# Patient Record
Sex: Female | Born: 1985 | Race: Black or African American | Hispanic: No | Marital: Single | Smoking: Never smoker
Health system: Southern US, Community
[De-identification: ages and names within clinical notes are randomized; demographics above are authoritative.]

## PROBLEM LIST (undated history)

## (undated) ENCOUNTER — Inpatient Hospital Stay (HOSPITAL_COMMUNITY): Payer: Self-pay

## (undated) DIAGNOSIS — D649 Anemia, unspecified: Secondary | ICD-10-CM

## (undated) DIAGNOSIS — E669 Obesity, unspecified: Secondary | ICD-10-CM

## (undated) DIAGNOSIS — N390 Urinary tract infection, site not specified: Secondary | ICD-10-CM

## (undated) DIAGNOSIS — A749 Chlamydial infection, unspecified: Secondary | ICD-10-CM

## (undated) DIAGNOSIS — I10 Essential (primary) hypertension: Secondary | ICD-10-CM

## (undated) HISTORY — PX: WISDOM TOOTH EXTRACTION: SHX21

## (undated) HISTORY — DX: Anemia, unspecified: D64.9

## (undated) HISTORY — PX: GASTRIC BYPASS: SHX52

## (undated) HISTORY — DX: Essential (primary) hypertension: I10

## (undated) HISTORY — DX: Obesity, unspecified: E66.9

---
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
## 2009-05-12 ENCOUNTER — Inpatient Hospital Stay (HOSPITAL_COMMUNITY): Admission: AD | Admit: 2009-05-12 | Discharge: 2009-05-12 | Payer: Self-pay | Admitting: Obstetrics & Gynecology

## 2009-05-18 ENCOUNTER — Ambulatory Visit: Payer: Self-pay | Admitting: Obstetrics and Gynecology

## 2009-05-18 ENCOUNTER — Inpatient Hospital Stay (HOSPITAL_COMMUNITY): Admission: AD | Admit: 2009-05-18 | Discharge: 2009-05-18 | Payer: Self-pay | Admitting: Obstetrics

## 2009-07-04 ENCOUNTER — Ambulatory Visit (HOSPITAL_COMMUNITY): Admission: RE | Admit: 2009-07-04 | Discharge: 2009-07-04 | Payer: Self-pay | Admitting: Obstetrics & Gynecology

## 2009-11-19 ENCOUNTER — Inpatient Hospital Stay (HOSPITAL_COMMUNITY): Admission: AD | Admit: 2009-11-19 | Discharge: 2009-11-19 | Payer: Self-pay | Admitting: Obstetrics and Gynecology

## 2009-11-21 ENCOUNTER — Inpatient Hospital Stay (HOSPITAL_COMMUNITY): Admission: AD | Admit: 2009-11-21 | Discharge: 2009-11-21 | Payer: Self-pay | Admitting: Obstetrics and Gynecology

## 2009-11-22 ENCOUNTER — Inpatient Hospital Stay (HOSPITAL_COMMUNITY): Admission: AD | Admit: 2009-11-22 | Discharge: 2009-11-22 | Payer: Self-pay | Admitting: Obstetrics and Gynecology

## 2009-11-24 ENCOUNTER — Inpatient Hospital Stay (HOSPITAL_COMMUNITY): Admission: AD | Admit: 2009-11-24 | Discharge: 2009-11-26 | Payer: Self-pay | Admitting: Obstetrics and Gynecology

## 2010-05-14 LAB — URINALYSIS, ROUTINE W REFLEX MICROSCOPIC
Bilirubin Urine: NEGATIVE
Glucose, UA: NEGATIVE mg/dL
Hgb urine dipstick: NEGATIVE
Ketones, ur: NEGATIVE mg/dL
Nitrite: NEGATIVE
Urobilinogen, UA: 1 mg/dL (ref 0.0–1.0)
pH: 6 (ref 5.0–8.0)

## 2010-05-14 LAB — COMPREHENSIVE METABOLIC PANEL
ALT: 14 U/L (ref 0–35)
ALT: 16 U/L (ref 0–35)
ALT: 18 U/L (ref 0–35)
AST: 16 U/L (ref 0–37)
AST: 28 U/L (ref 0–37)
Albumin: 2.6 g/dL — ABNORMAL LOW (ref 3.5–5.2)
Albumin: 2.7 g/dL — ABNORMAL LOW (ref 3.5–5.2)
Alkaline Phosphatase: 137 U/L — ABNORMAL HIGH (ref 39–117)
BUN: 4 mg/dL — ABNORMAL LOW (ref 6–23)
CO2: 23 mEq/L (ref 19–32)
Calcium: 9.3 mg/dL (ref 8.4–10.5)
Calcium: 9.4 mg/dL (ref 8.4–10.5)
Chloride: 104 mEq/L (ref 96–112)
Chloride: 106 mEq/L (ref 96–112)
Creatinine, Ser: 0.51 mg/dL (ref 0.4–1.2)
GFR calc Af Amer: >60 mL/min (ref >60)
GFR calc Af Amer: >60 mL/min (ref >60)
GFR calc non Af Amer: >60 mL/min (ref >60)
GFR calc non Af Amer: >60 mL/min (ref >60)
Glucose, Bld: 88 mg/dL (ref 70–99)
Glucose, Bld: 90 mg/dL (ref 70–99)
Potassium: 3.3 mEq/L — ABNORMAL LOW (ref 3.5–5.1)
Potassium: 3.8 mEq/L (ref 3.5–5.1)
Sodium: 134 mEq/L — ABNORMAL LOW (ref 135–145)
Sodium: 137 mEq/L (ref 135–145)
Total Bilirubin: 0.3 mg/dL (ref 0.3–1.2)
Total Protein: 5.8 g/dL — ABNORMAL LOW (ref 6.0–8.3)
Total Protein: 6.6 g/dL (ref 6.0–8.3)

## 2010-05-14 LAB — LACTATE DEHYDROGENASE: LDH: 141 U/L (ref 94–250)

## 2010-05-14 LAB — CBC
HCT: 29.1 % — ABNORMAL LOW (ref 36.0–46.0)
HCT: 35 % — ABNORMAL LOW (ref 36.0–46.0)
HCT: 35.4 % — ABNORMAL LOW (ref 36.0–46.0)
Hemoglobin: 11.3 g/dL — ABNORMAL LOW (ref 12.0–15.0)
Hemoglobin: 11.5 g/dL — ABNORMAL LOW (ref 12.0–15.0)
Hemoglobin: 9.5 g/dL — ABNORMAL LOW (ref 12.0–15.0)
MCH: 24.9 pg — ABNORMAL LOW (ref 26.0–34.0)
MCHC: 32.7 g/dL (ref 30.0–36.0)
MCV: 75.4 fL — ABNORMAL LOW (ref 78.0–100.0)
MCV: 75.6 fL — ABNORMAL LOW (ref 78.0–100.0)
Platelets: 268 10*3/uL (ref 150–400)
Platelets: 302 10*3/uL (ref 150–400)
Platelets: 312 10*3/uL (ref 150–400)
RBC: 4.6 MIL/uL (ref 3.87–5.11)
RBC: 4.63 MIL/uL (ref 3.87–5.11)
RDW: 16.9 % — ABNORMAL HIGH (ref 11.5–15.5)
RDW: 17.2 % — ABNORMAL HIGH (ref 11.5–15.5)
WBC: 6.5 10*3/uL (ref 4.0–10.5)
WBC: 7.3 10*3/uL (ref 4.0–10.5)
WBC: 7.4 10*3/uL (ref 4.0–10.5)
WBC: 7.7 10*3/uL (ref 4.0–10.5)

## 2010-05-14 LAB — URIC ACID: Uric Acid, Serum: 6 mg/dL (ref 2.4–7.0)

## 2010-05-24 LAB — URINALYSIS, ROUTINE W REFLEX MICROSCOPIC
Glucose, UA: NEGATIVE mg/dL
Ketones, ur: NEGATIVE mg/dL
Leukocytes, UA: NEGATIVE
Specific Gravity, Urine: 1.025 (ref 1.005–1.030)
Specific Gravity, Urine: >1.03 — ABNORMAL HIGH (ref 1.005–1.030)
Urobilinogen, UA: 0.2 mg/dL (ref 0.0–1.0)
pH: 6 (ref 5.0–8.0)
pH: 6 (ref 5.0–8.0)

## 2010-05-24 LAB — CBC
MCHC: 31.4 g/dL (ref 30.0–36.0)
WBC: 6.7 10*3/uL (ref 4.0–10.5)

## 2010-05-24 LAB — COMPREHENSIVE METABOLIC PANEL
AST: 21 U/L (ref 0–37)
Albumin: 3 g/dL — ABNORMAL LOW (ref 3.5–5.2)
Alkaline Phosphatase: 86 U/L (ref 39–117)
BUN: 3 mg/dL — ABNORMAL LOW (ref 6–23)
GFR calc Af Amer: >60 mL/min (ref >60)
GFR calc non Af Amer: >60 mL/min (ref >60)
Glucose, Bld: 84 mg/dL (ref 70–99)
Total Bilirubin: 0.5 mg/dL (ref 0.3–1.2)

## 2010-05-24 LAB — URINE MICROSCOPIC-ADD ON

## 2010-07-14 NOTE — H&P (Signed)
Connie Taylor, MOTSINGER              ACCOUNT NO.:  [id number]   MEDICAL RECORD NO.:  [id number]          PATIENT TYPE:  INP   LOCATION:  9173                          FACILITY:  [facility]   PHYSICIAN:  Charles A. Clearance Coots, M.D.DATE OF BIRTH:  December 27, 1985   DATE OF ADMISSION:  09/07/2006  DATE OF DISCHARGE:                              HISTORY & PHYSICAL   REASON FOR ADMISSION:  25 year old black female, G1, estimated date of  confinement of October 02, 2006 presented to the office for prenatal visit  on her last  prenatal visit and had elevated blood pressures.  The  patient has been followed for slightly elevated blood pressures  throughout the pregnancy.  She transferred her care from [place] at late  third trimester.  The patient denies headache or visual changes.  Had  been sent to [facility] over the past week for preeclampsia  workup, her preeclampsia labs have been within normal limits, but the  patient has continued to have labile blood pressures.  She is therefore  admitted to the hospital for blood pressure management and to get  started on oral antihypertensive agent.   PAST SURGICAL HISTORY:  None.   PAST MEDICAL HISTORY:  Anemia.   MEDICATIONS:  Prenatal vitamins.   ALLERGIES:  NO KNOWN DRUG ALLERGIES.   SOCIAL HISTORY:  Single.  Negative tobacco, alcohol, or recreational  drug use.   FAMILY HISTORY:  No major illnesses known.   PHYSICAL EXAMINATION:  GENERAL:  Obese black female in no acute  distress.  VITAL SIGNS:  Afebrile.  Blood pressure 168/102.  LUNGS:  Clear to auscultation bilaterally.  HEART:  Regular rate and rhythm.  ABDOMEN:  Gravid, nontender.  PELVIC:  Cervix is 1-2 cm dilated, long, and the vertex was at a -3  station.   IMPRESSION:  [redacted] weeks gestation with pregnancy-induced hypertension.   PLAN:  Admit to be started on oral antihypertensive and then discharge  home on bedrest once blood pressures are stable.      Charles A. Clearance Coots, M.D.  Electronically Signed     CAH/MEDQ  D:  09/07/2006  T:  09/08/2006  Job:  [id number]   cc:   Labor and Delivery Suite

## 2010-12-15 LAB — URINALYSIS, ROUTINE W REFLEX MICROSCOPIC
Bilirubin Urine: NEGATIVE
Bilirubin Urine: NEGATIVE
Glucose, UA: NEGATIVE
Hgb urine dipstick: NEGATIVE
Ketones, ur: NEGATIVE
Ketones, ur: NEGATIVE
Nitrite: NEGATIVE
Specific Gravity, Urine: 1.015
Specific Gravity, Urine: <1.005 — ABNORMAL LOW
pH: 6
pH: 6.5

## 2010-12-15 LAB — CBC
HCT: 29.1 — ABNORMAL LOW
HCT: 33.5 — ABNORMAL LOW
HCT: 34.1 — ABNORMAL LOW
Hemoglobin: 10.6 — ABNORMAL LOW
Hemoglobin: 10.7 — ABNORMAL LOW
Hemoglobin: 10.8 — ABNORMAL LOW
MCHC: 31.6
MCHC: 31.6
MCHC: 31.8
MCHC: 31.9
MCV: 72.4 — ABNORMAL LOW
MCV: 72.4 — ABNORMAL LOW
MCV: 72.7 — ABNORMAL LOW
MCV: 72.7 — ABNORMAL LOW
MCV: 72.8 — ABNORMAL LOW
Platelets: 307
Platelets: 338
RBC: 4.47
RBC: 4.62
RBC: 4.68
RBC: 4.7
WBC: 11.4 — ABNORMAL HIGH
WBC: 11.5 — ABNORMAL HIGH
WBC: 6.8
WBC: 8.1

## 2010-12-15 LAB — COMPREHENSIVE METABOLIC PANEL
ALT: 13
AST: 18
Albumin: 2.5 — ABNORMAL LOW
Albumin: 2.7 — ABNORMAL LOW
Alkaline Phosphatase: 214 — ABNORMAL HIGH
BUN: 1 — ABNORMAL LOW
CO2: 24
CO2: 24
Calcium: 9.2
Chloride: 106
Creatinine, Ser: 0.5
GFR calc Af Amer: >60
GFR calc non Af Amer: >60
GFR calc non Af Amer: >60
Glucose, Bld: 86
Potassium: 3.7
Sodium: 137
Total Bilirubin: 0.5
Total Protein: 6.3

## 2010-12-15 LAB — URINE MICROSCOPIC-ADD ON

## 2010-12-15 LAB — LACTATE DEHYDROGENASE: LDH: 145

## 2010-12-16 LAB — URINALYSIS, ROUTINE W REFLEX MICROSCOPIC
Bilirubin Urine: NEGATIVE
Glucose, UA: NEGATIVE
Ketones, ur: NEGATIVE
Leukocytes, UA: NEGATIVE
Nitrite: NEGATIVE
Protein, ur: NEGATIVE
Specific Gravity, Urine: <1.005 — ABNORMAL LOW
Urobilinogen, UA: 1
pH: 7

## 2010-12-16 LAB — URINE MICROSCOPIC-ADD ON

## 2010-12-17 LAB — URINE MICROSCOPIC-ADD ON

## 2010-12-17 LAB — URINE CULTURE

## 2010-12-17 LAB — URINALYSIS, ROUTINE W REFLEX MICROSCOPIC
Glucose, UA: NEGATIVE
Ketones, ur: NEGATIVE
pH: 6

## 2011-01-19 ENCOUNTER — Inpatient Hospital Stay (HOSPITAL_COMMUNITY)
Admission: AD | Admit: 2011-01-19 | Discharge: 2011-01-19 | Disposition: A | Discharge status: Home / Self Care | Payer: 59 | Source: Ambulatory Visit | Attending: Family Medicine | Admitting: Family Medicine

## 2011-01-19 ENCOUNTER — Encounter (HOSPITAL_COMMUNITY): Payer: Self-pay | Admitting: *Deleted

## 2011-01-19 DIAGNOSIS — N898 Other specified noninflammatory disorders of vagina: Secondary | ICD-10-CM | POA: Insufficient documentation

## 2011-01-19 DIAGNOSIS — Z3201 Encounter for pregnancy test, result positive: Secondary | ICD-10-CM

## 2011-01-19 DIAGNOSIS — O99891 Other specified diseases and conditions complicating pregnancy: Secondary | ICD-10-CM | POA: Insufficient documentation

## 2011-01-19 HISTORY — DX: Chlamydial infection, unspecified: A74.9

## 2011-01-19 LAB — URINALYSIS, ROUTINE W REFLEX MICROSCOPIC
Bilirubin Urine: NEGATIVE
Glucose, UA: NEGATIVE mg/dL
Hgb urine dipstick: NEGATIVE
Specific Gravity, Urine: 1.025 (ref 1.005–1.030)

## 2011-01-19 LAB — WET PREP, GENITAL: Yeast Wet Prep HPF POC: NONE SEEN

## 2011-01-19 LAB — POCT PREGNANCY, URINE: Preg Test, Ur: POSITIVE

## 2011-01-19 NOTE — Progress Notes (Signed)
Patient had an Implanon removed in June 2012, has had irregular cycles.

## 2011-01-19 NOTE — ED Provider Notes (Signed)
History     CSN: [id number] Arrival date & time: 01/19/2011  6:04 PM   None     Chief Complaint  Patient presents with  . Dysuria    HPI Connie Taylor is a 25 y.o. female at [redacted]w[redacted]d gestation who presents to [facility] for vaginal discharge and a pressure feeling with urination. Denies vaginal bleeding or abdominal pain. She has been with her current sex partner x 4 years. Last pap smear one year ago and was normal. History of Chlamydia > 4 years ago. Had implant contraception but had it removed in August. LMP 11/14/10.  Patient did not know she was pregnant. States that she was told her periods may be irregular for a while after the implant was out. States she is happy to be pregnant. She denies bleeding or abdominal pain. The history was provided by the patient.  Past Medical History  Diagnosis Date  . No pertinent past medical history   . Chlamydia 4 years ago    Past Surgical History  Procedure Date  . No past surgeries     Family History  Problem Relation Age of Onset  . Diabetes Neg Hx   . Cancer Neg Hx   . Hypertension Neg Hx     History  Substance Use Topics  . Smoking status: Never Smoker   . Smokeless tobacco: Not on file  . Alcohol Use: No    OB History    Grav Para Term Preterm Abortions TAB SAB Ect Mult Living   1               Review of Systems  Constitutional: Negative for fever, chills, diaphoresis and fatigue.  HENT: Negative for ear pain, congestion, sore throat, neck pain, dental problem and sinus pressure.   Eyes: Negative for photophobia, pain and discharge.  Respiratory: Negative for cough, chest tightness and wheezing.   Gastrointestinal: Negative for nausea, vomiting, abdominal pain, diarrhea, constipation and abdominal distention.  Genitourinary: Positive for vaginal discharge. Negative for dysuria, urgency, frequency, flank pain, vaginal bleeding, difficulty urinating and pelvic pain.  Musculoskeletal: Positive for back pain. Negative for  myalgias and gait problem.  Skin: Negative for color change and rash.  Neurological: Negative for dizziness, speech difficulty, weakness, light-headedness, numbness and headaches.  Psychiatric/Behavioral: Negative for confusion and agitation.    Allergies  Review of patient's allergies indicates not on file.  Home Medications  No current outpatient prescriptions on file.  BP 150/85  Pulse 92  Temp(Src) 99.1 F (37.3 C) (Oral)  Resp 22  Ht 5\' 7"  (1.702 m)  Wt 284 lb 9.6 oz (129.094 kg)  BMI 44.57 kg/m2  SpO2 99%  LMP 11/14/2010  Physical Exam  Nursing note and vitals reviewed. Constitutional: She is oriented to person, place, and time.       obese  HENT:  Head: Normocephalic and atraumatic.  Eyes: EOM are normal.  Neck: Neck supple.  Cardiovascular: Normal rate.   Pulmonary/Chest: Effort normal. No respiratory distress.  Abdominal: Soft. There is no tenderness.  Genitourinary:       External genitalia without lesions. White discharge vaginal vault. Cervix closed, long, no CMT, no adnexal tenderness. Unable to determine size of uterus due to patient habitus.  Musculoskeletal: Normal range of motion.  Neurological: She is alert and oriented to person, place, and time. No cranial nerve deficit.  Skin: Skin is warm and dry.   Results for orders placed during the hospital encounter of 01/19/11 (from the past 24 hour(s))  URINALYSIS, ROUTINE W REFLEX MICROSCOPIC     Status: Normal   Collection Time   01/19/11  6:40 PM      Component Value Range   Color, Urine YELLOW  YELLOW    Appearance CLEAR  CLEAR    Specific Gravity, Urine 1.025  1.005 - 1.030    pH 6.5  5.0 - 8.0    Glucose, UA NEGATIVE  NEGATIVE (mg/dL)   Hgb urine dipstick NEGATIVE  NEGATIVE    Bilirubin Urine NEGATIVE  NEGATIVE    Ketones, ur NEGATIVE  NEGATIVE (mg/dL)   Protein, ur NEGATIVE  NEGATIVE (mg/dL)   Urobilinogen, UA 0.2  0.0 - 1.0 (mg/dL)   Nitrite NEGATIVE  NEGATIVE    Leukocytes, UA NEGATIVE   NEGATIVE   POCT PREGNANCY, URINE     Status: Normal   Collection Time   01/19/11  6:47 PM      Component Value Range   Preg Test, Ur POSITIVE    WET PREP, GENITAL     Status: Abnormal   Collection Time   01/19/11  9:10 PM      Component Value Range   Yeast, Wet Prep NONE SEEN  NONE SEEN    Trich, Wet Prep NONE SEEN  NONE SEEN    Clue Cells, Wet Prep NONE SEEN  NONE SEEN    WBC, Wet Prep HPF POC FEW (*) NONE SEEN     Assessment: Pregnancy   Vaginal discharge  Plan:  Start prenatal care   UC, GC, CT cultures pending   Return for problems. Patient plans [facility] Care with CC/OB ED Course  Procedures    MDM          [facility], NP 01/19/11 2133

## 2011-01-19 NOTE — Progress Notes (Signed)
Patient states she has had pain with urination on and off for about 2 days. Has a slight discharge when wiping after urinating. Has low back pain on and off, no pain at this time.

## 2011-01-19 NOTE — Progress Notes (Signed)
Pt in c/o vaginal discharge with occasional burning with urination.  Reports intermittent lower back pain.  Symptoms x2 days.  Denies any new sex partners.

## 2011-01-20 LAB — URINE CULTURE: Colony Count: 40000

## 2011-01-20 NOTE — ED Provider Notes (Signed)
Chart reviewed and agree with management and plan.  

## 2011-01-30 LAB — OB RESULTS CONSOLE ABO/RH: RH Type: POSITIVE

## 2011-01-30 LAB — OB RESULTS CONSOLE RUBELLA ANTIBODY, IGM: Rubella: NON-IMMUNE/NOT IMMUNE

## 2011-01-30 LAB — OB RESULTS CONSOLE ANTIBODY SCREEN: Antibody Screen: NEGATIVE

## 2011-01-30 LAB — OB RESULTS CONSOLE HIV ANTIBODY (ROUTINE TESTING): HIV: NONREACTIVE

## 2011-03-24 ENCOUNTER — Encounter (HOSPITAL_COMMUNITY): Payer: Self-pay

## 2011-03-24 ENCOUNTER — Inpatient Hospital Stay (HOSPITAL_COMMUNITY)
Admission: AD | Admit: 2011-03-24 | Discharge: 2011-03-24 | Disposition: A | Discharge status: Home / Self Care | Payer: 59 | Source: Ambulatory Visit | Attending: Obstetrics and Gynecology | Admitting: Obstetrics and Gynecology

## 2011-03-24 DIAGNOSIS — R51 Headache: Secondary | ICD-10-CM | POA: Insufficient documentation

## 2011-03-24 DIAGNOSIS — O99891 Other specified diseases and conditions complicating pregnancy: Secondary | ICD-10-CM | POA: Insufficient documentation

## 2011-03-24 DIAGNOSIS — O26899 Other specified pregnancy related conditions, unspecified trimester: Secondary | ICD-10-CM

## 2011-03-24 DIAGNOSIS — R030 Elevated blood-pressure reading, without diagnosis of hypertension: Secondary | ICD-10-CM | POA: Insufficient documentation

## 2011-03-24 DIAGNOSIS — E669 Obesity, unspecified: Secondary | ICD-10-CM | POA: Insufficient documentation

## 2011-03-24 DIAGNOSIS — O99210 Obesity complicating pregnancy, unspecified trimester: Secondary | ICD-10-CM | POA: Insufficient documentation

## 2011-03-24 MED ORDER — IBUPROFEN 800 MG PO TABS
800.0000 mg | ORAL_TABLET | Freq: Once | ORAL | Status: AC
  Administered 2011-03-24: 800 mg via ORAL
  Filled 2011-03-24: qty 1

## 2011-03-24 NOTE — Progress Notes (Signed)
Pt states she has had a headache since Monday , tylenol has worked up until today, and is not longer helping

## 2011-03-24 NOTE — ED Provider Notes (Signed)
History   Connie Taylor is a 26 y.o. Obese black female who presents unannounced at 14.1 weeks per [facility] 09/21/11 with CC of headache w/ onset since this AM after she awoke, that hasn't resolved w/ Tylenol.  Headache is just on Pt's Lt side and pain is concentrated in frontal and temporal area and "behind eye."  She has taken Tylenol today and last dose around 1700.  Only other c/o is nausea, and feels related to pain and because hasn't felt like eating very much today.  Had lunch and since has only had "chips" around 1500.  No emesis.  No fever, chills, or body aches.  No recent accidents or injuries.  Pt was primarily concerned about headache b/c she has had PIH w/ previous pregnancies and was concerned blood pressure may be elevated.  Pt denies UTI s/s, resp, or other GI complaints.   Lights were off in room when went in for eval.  Pt lying on Rt side, b/c hurts if she tries to lay on her Lt side. Can't sleep b/c of pain.  No previous h/o migraines. Pregnancy r/f:  1.  Closely-spaced pregnancies  2. obese  3.  H/o anemia  4.  Ho chlamydia  5.  H/o PIH x2 vs CHTN  6.  Rubella non-immune Chief Complaint  Patient presents with  . Headache   HPI  OB History    Grav Para Term Preterm Abortions TAB SAB Ect Mult Living   3 2 2  0 0 0 0 0 0 2      Past Medical History  Diagnosis Date  . No pertinent past medical history   . Chlamydia 4 years ago    Past Surgical History  Procedure Date  . No past surgeries     Family History  Problem Relation Age of Onset  . Diabetes Neg Hx   . Cancer Neg Hx   . Hypertension Neg Hx     History  Substance Use Topics  . Smoking status: Never Smoker   . Smokeless tobacco: Not on file  . Alcohol Use: No    Allergies: No Known Allergies  Prescriptions prior to admission  Medication Sig Dispense Refill  . acetaminophen (TYLENOL) 500 MG tablet Take 1,000 mg by mouth every 6 (six) hours as needed.      . Prenatal Vit-Fe Fumarate-FA (PRENATAL  MULTIVITAMIN) TABS Take 1 tablet by mouth daily.        ROS--see history above Physical Exam  Blood pressure 137/73, pulse 64, temperature 98.7 F (37.1 C), temperature source Oral, resp. rate 18, last menstrual period 11/14/2010, SpO2 99.00%. .. Filed Vitals:   03/24/11 1846 03/24/11 2159  BP: 143/87 137/73  Pulse: 99 64  Temp: 98.7 F (37.1 C)   TempSrc: Oral   Resp: 20 18  SpO2: 99%    Physical Exam  Constitutional: She is oriented to person, place, and time. She appears well-developed and well-nourished. She appears distressed.       Grimace, guarded and holding hand over Lt side of head and face  HENT:  Head: Normocephalic and atraumatic.  Neck: Normal range of motion. Neck supple.  Cardiovascular: Normal rate.   Respiratory: Effort normal.  GI: Soft.  Genitourinary:       deferred  Neurological: She is alert and oriented to person, place, and time.  Skin: Skin is warm and dry.  Psychiatric: She has a normal mood and affect. Her behavior is normal. Thought content normal.    MAU Course  Procedures  1.  Motrin 800mg  po x1 around 2100 2.  By 2200, after Motrin, can of coke, and graham crackers, pt's headache improved and pain 3/10.  Assessment and Plan  1.  IUP at 14.1 weeks 2.  Lt sided Headache--improved after w/ Motrin, coke, crackers 3.  Obese 4.  Borderline BP's w/ h/o PIH--suspect CHTN but no medication required at this level during pregnancy 5.  Closely-spaced pregnancies  1.  D/c home to f/u as scheduled at [facility] or prn s/s worsen 2. Disc'd comfort measures, including Motrin prn HA in future until 3rd trimester  STEELMAN,CANDICE H 03/24/2011, 10:05 PM

## 2011-03-24 NOTE — Progress Notes (Signed)
Patient states she has had a headache on the right side for about 3 days. Has been taking Tylenol with minimal relief but has not helped the last time she took it. Denies any abdominal pain or bleeding.

## 2011-05-03 ENCOUNTER — Other Ambulatory Visit: Payer: 59

## 2011-05-03 ENCOUNTER — Encounter (INDEPENDENT_AMBULATORY_CARE_PROVIDER_SITE_OTHER): Payer: 59 | Admitting: Obstetrics and Gynecology

## 2011-05-03 ENCOUNTER — Encounter: Payer: Self-pay | Admitting: Obstetrics and Gynecology

## 2011-05-03 ENCOUNTER — Other Ambulatory Visit: Payer: Self-pay

## 2011-05-03 DIAGNOSIS — Z363 Encounter for antenatal screening for malformations: Secondary | ICD-10-CM

## 2011-05-03 DIAGNOSIS — O99210 Obesity complicating pregnancy, unspecified trimester: Secondary | ICD-10-CM

## 2011-05-03 DIAGNOSIS — E669 Obesity, unspecified: Secondary | ICD-10-CM

## 2011-05-03 DIAGNOSIS — O09299 Supervision of pregnancy with other poor reproductive or obstetric history, unspecified trimester: Secondary | ICD-10-CM

## 2011-05-03 DIAGNOSIS — Z1389 Encounter for screening for other disorder: Secondary | ICD-10-CM

## 2011-05-31 ENCOUNTER — Encounter (INDEPENDENT_AMBULATORY_CARE_PROVIDER_SITE_OTHER): Payer: 59

## 2011-05-31 ENCOUNTER — Encounter (INDEPENDENT_AMBULATORY_CARE_PROVIDER_SITE_OTHER): Payer: 59 | Admitting: Obstetrics and Gynecology

## 2011-05-31 DIAGNOSIS — O358XX0 Maternal care for other (suspected) fetal abnormality and damage, not applicable or unspecified: Secondary | ICD-10-CM

## 2011-05-31 DIAGNOSIS — Z331 Pregnant state, incidental: Secondary | ICD-10-CM

## 2011-06-05 IMAGING — US US OB DETAIL+14 WK
2 series · 14 of 28 positions shown · non-contrast
Comparison: none

OBSTETRICAL ULTRASOUND:
 This ultrasound exam was performed in the [HOSPITAL] Ultrasound Department.  The OB US report was generated in the AS system, and faxed to the ordering physician.  This report is also available in [HOSPITAL]?s AccessANYware and in [REDACTED] PACS.

[Series 1: us ob detail +14 wk · 0.30mm/px · 12 of 78 slices shown (1 of 2)]
[im 4/78]
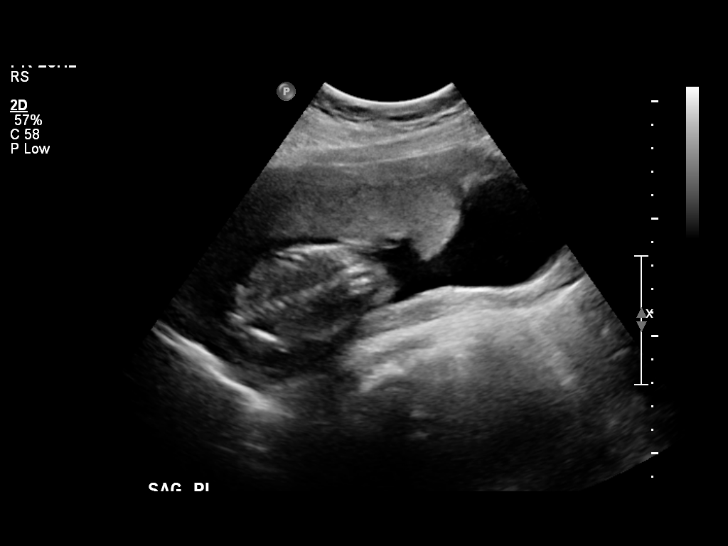
[im 10/78]
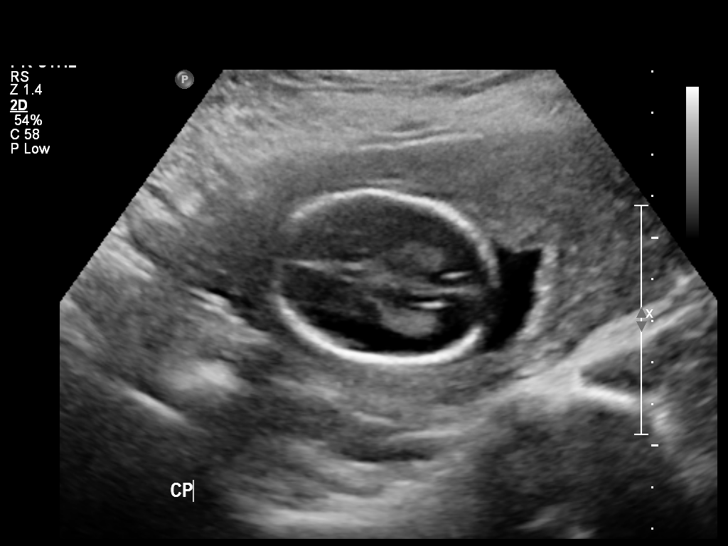
[im 17/78]
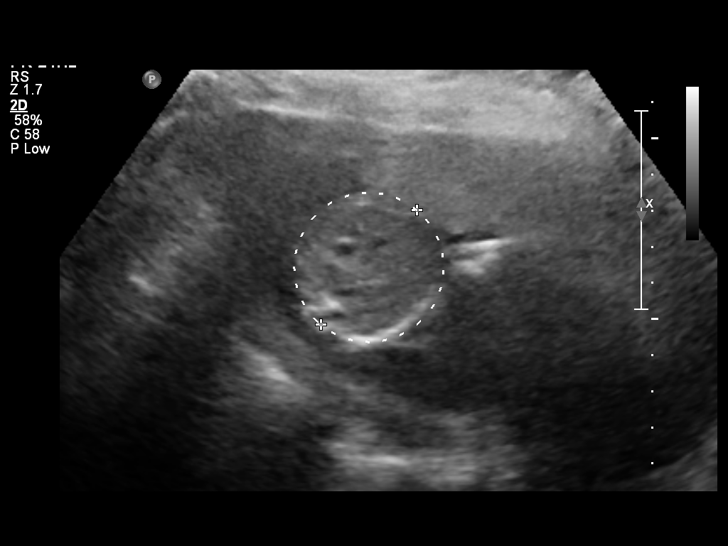
[im 23/78]
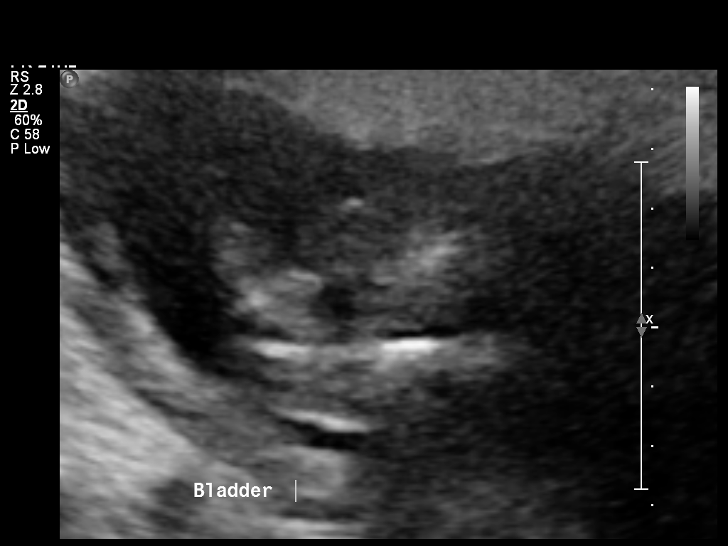
[im 29/78]
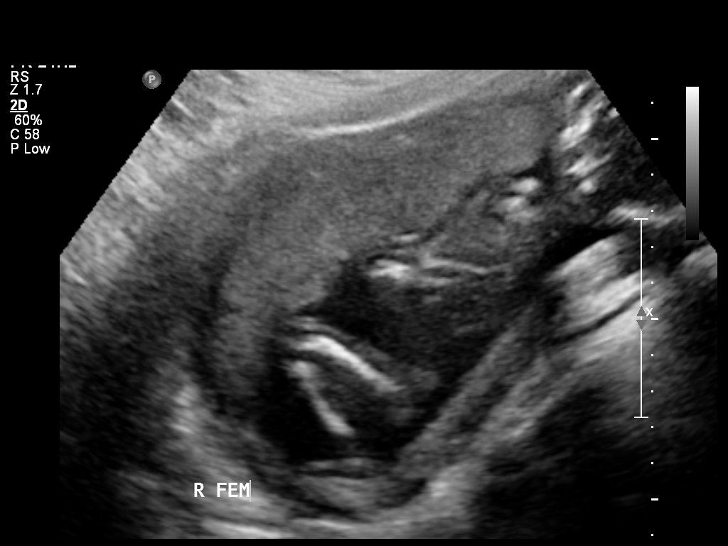
[im 36/78]
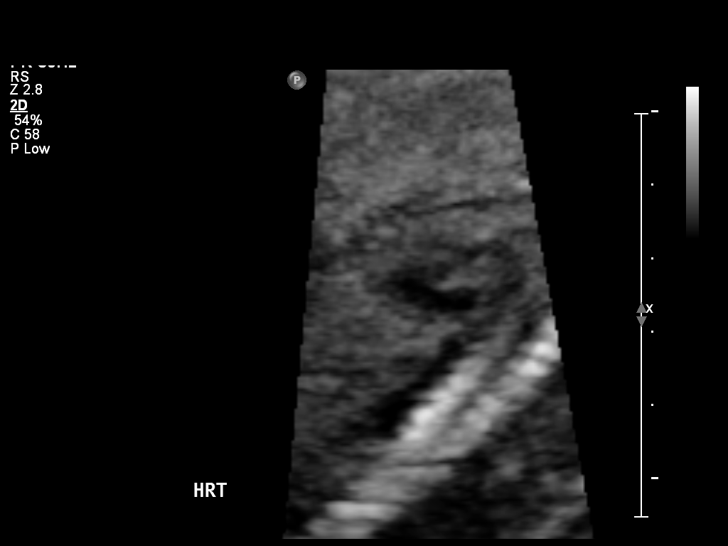
[im 42/78]
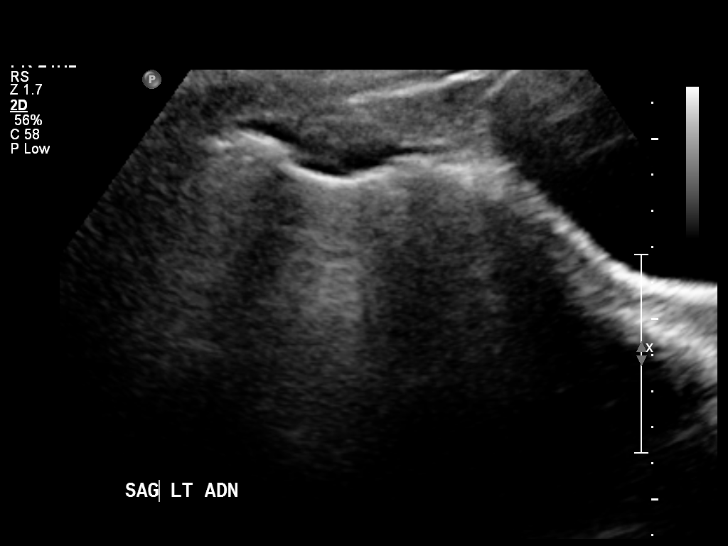
[im 49/78]
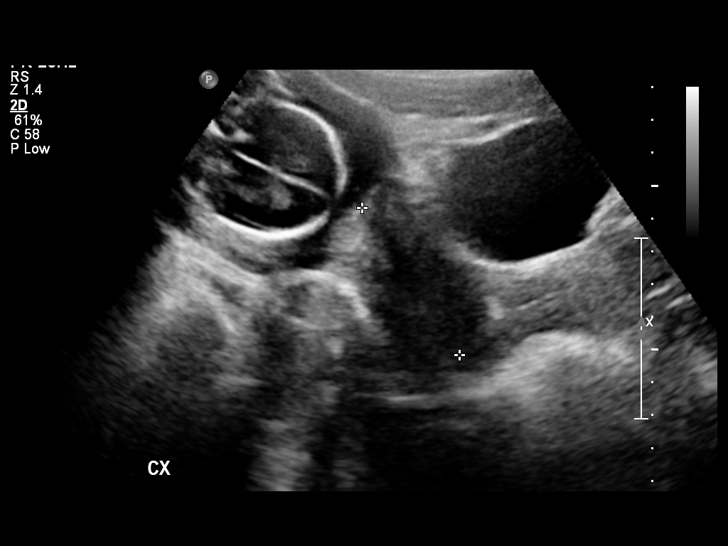
[im 55/78]
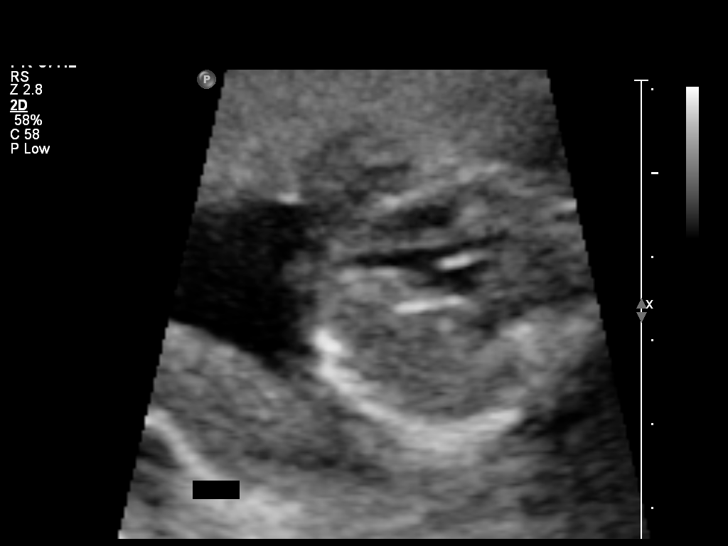
[im 61/78]
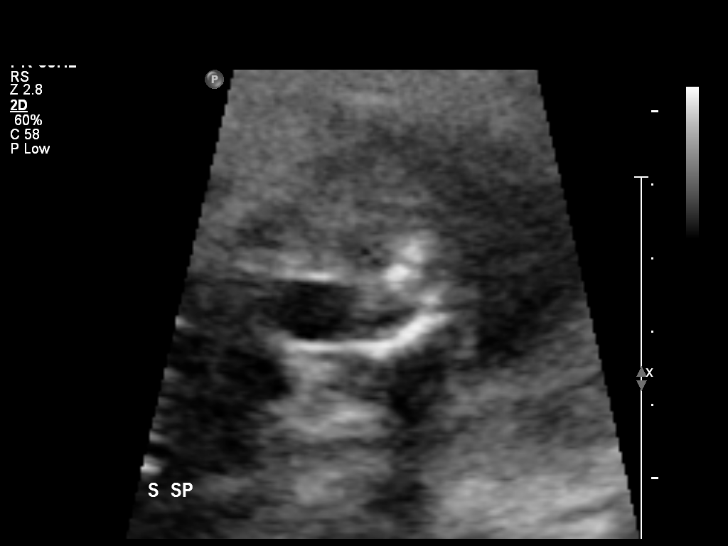
[im 68/78]
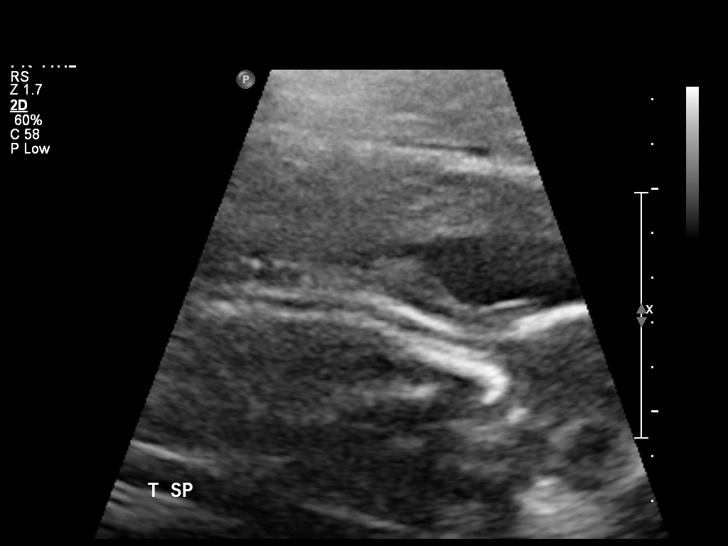
[im 74/78]
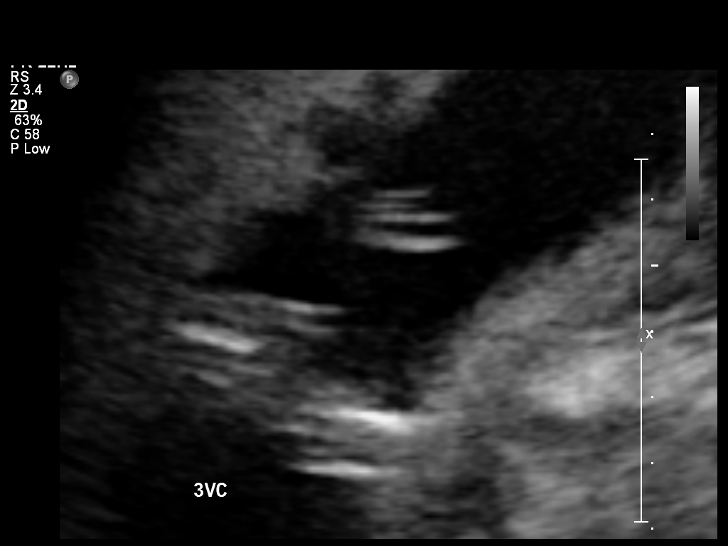

[Series 1: us ob detail +14 wk · 0.11mm/px · 2 of 8 slices shown (2 of 2)]
[im 1/8]
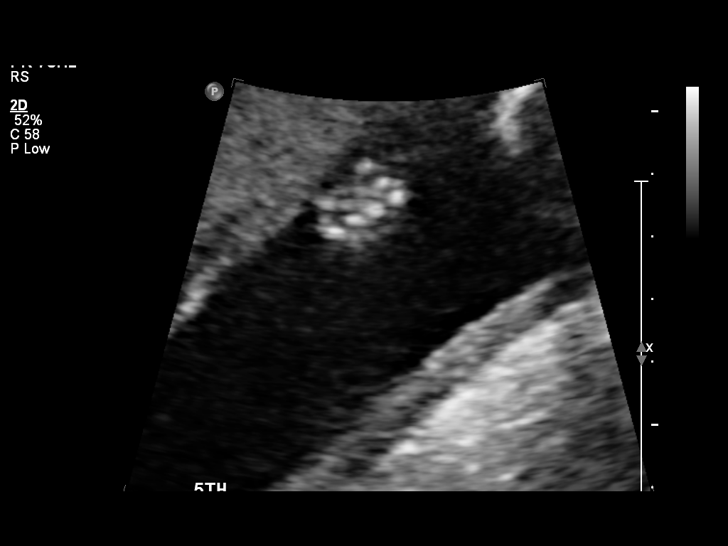
[im 8/8]
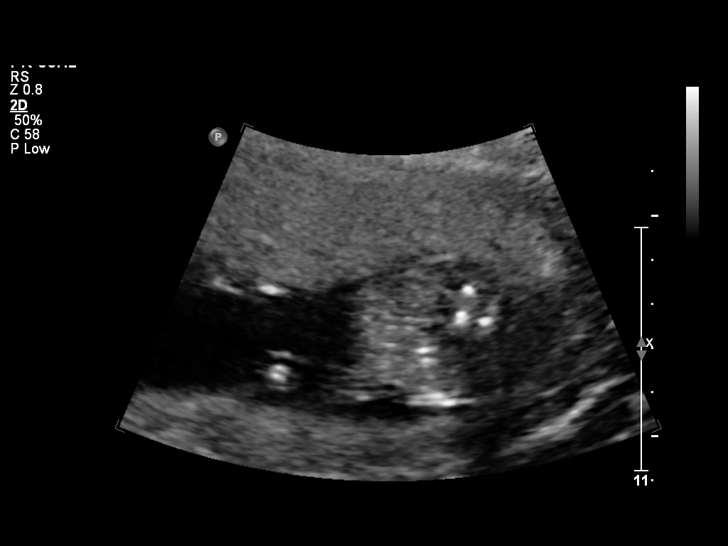

[14 of 28 positions shown; findings below may reference images not displayed]

IMPRESSION: See AS Obstetric US report.

## 2011-06-28 ENCOUNTER — Ambulatory Visit (INDEPENDENT_AMBULATORY_CARE_PROVIDER_SITE_OTHER): Payer: 59 | Admitting: Obstetrics and Gynecology

## 2011-06-28 ENCOUNTER — Encounter: Payer: Self-pay | Admitting: Obstetrics and Gynecology

## 2011-06-28 ENCOUNTER — Other Ambulatory Visit: Payer: 59

## 2011-06-28 VITALS — BP 112/62 | Wt 280.0 lb

## 2011-06-28 DIAGNOSIS — Z331 Pregnant state, incidental: Secondary | ICD-10-CM

## 2011-06-28 DIAGNOSIS — O26849 Uterine size-date discrepancy, unspecified trimester: Secondary | ICD-10-CM

## 2011-06-28 NOTE — Progress Notes (Signed)
No complaints Doing well Filed Vitals:   06/28/11 1540  BP: 112/62   S>D sched u/s for EFW Glucola today Sign tubal papers RTO 2wks for ROB and U/S O +

## 2011-06-29 LAB — CBC
HCT: 33.9 % — ABNORMAL LOW (ref 36.0–46.0)
MCH: 23.3 pg — ABNORMAL LOW (ref 26.0–34.0)
MCV: 77.4 fL — ABNORMAL LOW (ref 78.0–100.0)
RDW: 14.5 % (ref 11.5–15.5)
WBC: 8.6 10*3/uL (ref 4.0–10.5)

## 2011-07-06 DIAGNOSIS — R638 Other symptoms and signs concerning food and fluid intake: Secondary | ICD-10-CM | POA: Insufficient documentation

## 2011-07-06 DIAGNOSIS — D649 Anemia, unspecified: Secondary | ICD-10-CM

## 2011-07-06 DIAGNOSIS — O139 Gestational [pregnancy-induced] hypertension without significant proteinuria, unspecified trimester: Secondary | ICD-10-CM

## 2011-07-06 DIAGNOSIS — Z202 Contact with and (suspected) exposure to infections with a predominantly sexual mode of transmission: Secondary | ICD-10-CM | POA: Insufficient documentation

## 2011-07-09 ENCOUNTER — Encounter: Payer: Self-pay | Admitting: Obstetrics and Gynecology

## 2011-07-12 ENCOUNTER — Ambulatory Visit (INDEPENDENT_AMBULATORY_CARE_PROVIDER_SITE_OTHER): Payer: 59 | Admitting: Obstetrics and Gynecology

## 2011-07-12 ENCOUNTER — Ambulatory Visit (INDEPENDENT_AMBULATORY_CARE_PROVIDER_SITE_OTHER): Payer: 59

## 2011-07-12 VITALS — BP 122/70 | Wt 281.0 lb

## 2011-07-12 DIAGNOSIS — Z3490 Encounter for supervision of normal pregnancy, unspecified, unspecified trimester: Secondary | ICD-10-CM

## 2011-07-12 DIAGNOSIS — O26849 Uterine size-date discrepancy, unspecified trimester: Secondary | ICD-10-CM

## 2011-07-12 DIAGNOSIS — Z3480 Encounter for supervision of other normal pregnancy, unspecified trimester: Secondary | ICD-10-CM

## 2011-07-12 DIAGNOSIS — E669 Obesity, unspecified: Secondary | ICD-10-CM

## 2011-07-12 LAB — US OB FOLLOW UP

## 2011-07-12 NOTE — Progress Notes (Addendum)
No complaints U/S reviewed - 3lbs 8oz 64th %, ant placenta, vtx, Nl AFI FKCs  Hgb 10.2 rec FeSO4 325mg  poqd in addition to PNV RPR NR and GCT neg (91) RTO 2wks for ROB U/S in 4wks for EFW and BPP secondary to obesity

## 2011-07-27 ENCOUNTER — Ambulatory Visit (INDEPENDENT_AMBULATORY_CARE_PROVIDER_SITE_OTHER): Payer: 59 | Admitting: Obstetrics and Gynecology

## 2011-07-27 VITALS — BP 124/82 | Wt 285.0 lb

## 2011-07-27 DIAGNOSIS — IMO0002 Reserved for concepts with insufficient information to code with codable children: Secondary | ICD-10-CM

## 2011-07-27 DIAGNOSIS — Z331 Pregnant state, incidental: Secondary | ICD-10-CM

## 2011-07-27 DIAGNOSIS — O139 Gestational [pregnancy-induced] hypertension without significant proteinuria, unspecified trimester: Secondary | ICD-10-CM

## 2011-07-27 DIAGNOSIS — Z01818 Encounter for other preprocedural examination: Secondary | ICD-10-CM

## 2011-07-27 NOTE — Progress Notes (Signed)
Glucola 06/28/11: 91   Hgb 10.2   RPR neg Headache well controlled with Tylenol S>D ultrasound already scheduled in 2 weeks

## 2011-07-27 NOTE — Progress Notes (Signed)
Pt. Stated been having headaches for the past 5 days problems with her vision in her right left eye.285 lb

## 2011-08-04 ENCOUNTER — Inpatient Hospital Stay (HOSPITAL_COMMUNITY): Payer: 59

## 2011-08-04 ENCOUNTER — Encounter (HOSPITAL_COMMUNITY): Payer: Self-pay | Admitting: *Deleted

## 2011-08-04 ENCOUNTER — Inpatient Hospital Stay (HOSPITAL_COMMUNITY)
Admission: AD | Admit: 2011-08-04 | Discharge: 2011-08-04 | Disposition: A | Discharge status: Home / Self Care | Payer: 59 | Source: Ambulatory Visit | Attending: Obstetrics and Gynecology | Admitting: Obstetrics and Gynecology

## 2011-08-04 DIAGNOSIS — O368190 Decreased fetal movements, unspecified trimester, not applicable or unspecified: Secondary | ICD-10-CM | POA: Insufficient documentation

## 2011-08-04 DIAGNOSIS — O99891 Other specified diseases and conditions complicating pregnancy: Secondary | ICD-10-CM | POA: Insufficient documentation

## 2011-08-04 DIAGNOSIS — W010XXA Fall on same level from slipping, tripping and stumbling without subsequent striking against object, initial encounter: Secondary | ICD-10-CM | POA: Insufficient documentation

## 2011-08-04 HISTORY — DX: Urinary tract infection, site not specified: N39.0

## 2011-08-04 LAB — URINALYSIS, ROUTINE W REFLEX MICROSCOPIC
Glucose, UA: NEGATIVE mg/dL
Leukocytes, UA: NEGATIVE
pH: 6.5 (ref 5.0–8.0)

## 2011-08-04 LAB — URINE MICROSCOPIC-ADD ON

## 2011-08-04 NOTE — Discharge Instructions (Signed)
Preterm Labor Preterm labor is when labor starts at less than 37 weeks of pregnancy. The normal length of a pregnancy is 39 to 41 weeks. CAUSES Often, there is no identifiable underlying cause as to why a woman goes into preterm labor. However, one of the most common known causes of preterm labor is infection. Infections of the uterus, cervix, vagina, amniotic sac, bladder, kidney, or even the lungs (pneumonia) can cause labor to start. Other causes of preterm labor include:  Urogenital infections, such as yeast infections and bacterial vaginosis.   Uterine abnormalities (uterine shape, uterine septum, fibroids, bleeding from the placenta).   A cervix that has been operated on and opens prematurely.   Malformations in the baby.   Multiple gestations (twins, triplets, and so on).   Breakage of the amniotic sac.  Additional risk factors for preterm labor include:  Previous history of preterm labor.   Premature rupture of membranes (PROM).   A placenta that covers the opening of the cervix (placenta previa).   A placenta that separates from the uterus (placenta abruption).   A cervix that is too weak to hold the baby in the uterus (incompetence cervix).   Having too much fluid in the amniotic sac (polyhydramnios).   Taking illegal drugs or smoking while pregnant.   Not gaining enough weight while pregnant.   Women younger than 18 and older than 24 years old.   Low socioeconomic status.   African-American ethnicity.  SYMPTOMS Signs and symptoms of preterm labor include:  Menstrual-like cramps.   Contractions that are 30 to 70 seconds apart, become very regular, closer together, and are more intense and painful.   Contractions that start on the top of the uterus and spread down to the lower abdomen and back.   A sense of increased pelvic pressure or back pain.   A watery or bloody discharge that comes from the vagina.  DIAGNOSIS  A diagnosis can be confirmed by:  A  vaginal exam.   An ultrasound of the cervix.   Sampling (swabbing) cervico-vaginal secretions. These samples can be tested for the presence of fetal fibronectin. This is a protein found in cervical discharge which is associated with preterm labor.   Fetal monitoring.  TREATMENT  Depending on the length of the pregnancy and other circumstances, a caregiver may suggest bed rest. If necessary, there are medicines that can be given to stop contractions and to quicken fetal lung maturity. If labor happens before 34 weeks of pregnancy, a prolonged hospital stay may be recommended. Treatment depends on the condition of both the mother and baby. PREVENTION There are some things a mother can do to lower the risk of preterm labor in future pregnancies. A woman can:   Stop smoking.   Maintain healthy weight gain and avoid chemicals and drugs that are not necessary.   Be watchful for any type of infection.   Inform her caregiver if she has a known history of preterm labor.  Document Released: 05/08/2003 Document Revised: 02/04/2011 Document Reviewed: 06/12/2010 Exit[employer] Patient Information 2012 [employer].Fetal Movement Counts Patient Name: __________________________________________________ Patient Due Date: ____________________ Kick counts is highly recommended in high risk pregnancies, but it is a good idea for every pregnant woman to do. Start counting fetal movements at 28 weeks of the pregnancy. Fetal movements increase after eating a full meal or eating or drinking something sweet (the blood sugar is higher). It is also important to drink plenty of fluids (well hydrated) before doing the count. Lie   on your left side because it helps with the circulation or you can sit in a comfortable chair with your arms over your belly (abdomen) with no distractions around you. DOING THE COUNT  Try to do the count the same time of day each time you do it.   Mark the day and time, then see how long it  takes for you to feel 10 movements (kicks, flutters, swishes, rolls). You should have at least 10 movements within 2 hours. You will most likely feel 10 movements in much less than 2 hours. If you do not, wait an hour and count again. After a couple of days you will see a pattern.   What you are looking for is a change in the pattern or not enough counts in 2 hours. Is it taking longer in time to reach 10 movements?  SEEK MEDICAL CARE IF:  You feel less than 10 counts in 2 hours. Tried twice.   No movement in one hour.   The pattern is changing or taking longer each day to reach 10 counts in 2 hours.   You feel the baby is not moving as it usually does.  Date: ____________ Movements: ____________ Start time: ____________ Finish time: ____________  Date: ____________ Movements: ____________ Start time: ____________ Finish time: ____________ Date: ____________ Movements: ____________ Start time: ____________ Finish time: ____________ Date: ____________ Movements: ____________ Start time: ____________ Finish time: ____________ Date: ____________ Movements: ____________ Start time: ____________ Finish time: ____________ Date: ____________ Movements: ____________ Start time: ____________ Finish time: ____________ Date: ____________ Movements: ____________ Start time: ____________ Finish time: ____________ Date: ____________ Movements: ____________ Start time: ____________ Finish time: ____________  Date: ____________ Movements: ____________ Start time: ____________ Finish time: ____________ Date: ____________ Movements: ____________ Start time: ____________ Finish time: ____________ Date: ____________ Movements: ____________ Start time: ____________ Finish time: ____________ Date: ____________ Movements: ____________ Start time: ____________ Finish time: ____________ Date: ____________ Movements: ____________ Start time: ____________ Finish time: ____________ Date: ____________ Movements:  ____________ Start time: ____________ Finish time: ____________ Date: ____________ Movements: ____________ Start time: ____________ Finish time: ____________  Date: ____________ Movements: ____________ Start time: ____________ Finish time: ____________ Date: ____________ Movements: ____________ Start time: ____________ Finish time: ____________ Date: ____________ Movements: ____________ Start time: ____________ Finish time: ____________ Date: ____________ Movements: ____________ Start time: ____________ Finish time: ____________ Date: ____________ Movements: ____________ Start time: ____________ Finish time: ____________ Date: ____________ Movements: ____________ Start time: ____________ Finish time: ____________ Date: ____________ Movements: ____________ Start time: ____________ Finish time: ____________  Date: ____________ Movements: ____________ Start time: ____________ Finish time: ____________ Date: ____________ Movements: ____________ Start time: ____________ Finish time: ____________ Date: ____________ Movements: ____________ Start time: ____________ Finish time: ____________ Date: ____________ Movements: ____________ Start time: ____________ Finish time: ____________ Date: ____________ Movements: ____________ Start time: ____________ Finish time: ____________ Date: ____________ Movements: ____________ Start time: ____________ Finish time: ____________ Date: ____________ Movements: ____________ Start time: ____________ Finish time: ____________  Date: ____________ Movements: ____________ Start time: ____________ Finish time: ____________ Date: ____________ Movements: ____________ Start time: ____________ Finish time: ____________ Date: ____________ Movements: ____________ Start time: ____________ Finish time: ____________ Date: ____________ Movements: ____________ Start time: ____________ Finish time: ____________ Date: ____________ Movements: ____________ Start time: ____________ Finish  time: ____________ Date: ____________ Movements: ____________ Start time: ____________ Finish time: ____________ Date: ____________ Movements: ____________ Start time: ____________ Finish time: ____________  Date: ____________ Movements: ____________ Start time: ____________ Finish time: ____________ Date: ____________ Movements: ____________ Start time: ____________ Finish time: ____________ Date: ____________ Movements: ____________ Start time: ____________ Finish time: ____________   Date: ____________ Movements: ____________ Start time: ____________ Finish time: ____________ Date: ____________ Movements: ____________ Start time: ____________ Finish time: ____________ Date: ____________ Movements: ____________ Start time: ____________ Finish time: ____________ Date: ____________ Movements: ____________ Start time: ____________ Finish time: ____________  Date: ____________ Movements: ____________ Start time: ____________ Finish time: ____________ Date: ____________ Movements: ____________ Start time: ____________ Finish time: ____________ Date: ____________ Movements: ____________ Start time: ____________ Finish time: ____________ Date: ____________ Movements: ____________ Start time: ____________ Finish time: ____________ Date: ____________ Movements: ____________ Start time: ____________ Finish time: ____________ Date: ____________ Movements: ____________ Start time: ____________ Finish time: ____________ Date: ____________ Movements: ____________ Start time: ____________ Finish time: ____________  Date: ____________ Movements: ____________ Start time: ____________ Finish time: ____________ Date: ____________ Movements: ____________ Start time: ____________ Finish time: ____________ Date: ____________ Movements: ____________ Start time: ____________ Finish time: ____________ Date: ____________ Movements: ____________ Start time: ____________ Finish time: ____________ Date: ____________  Movements: ____________ Start time: ____________ Finish time: ____________ Date: ____________ Movements: ____________ Start time: ____________ Finish time: ____________ Document Released: 03/17/2006 Document Revised: 02/04/2011 Document Reviewed: 09/17/2008 [employer] Patient Information 2012 [employer]. 

## 2011-08-04 NOTE — MAU Provider Note (Signed)
History   26 yo G3P2002 at 23 1/7 weeks presented unannounced s/p fall at approx 3pm.  Was chasing her toddler, tripped, and fell on right side, toward back.  Did not strike abdomen.  No bleeding since event, mild cramping just after event.  Reports initially decreased FM, then noted normal movement.  O+ type.  Last growth [place] 07/12/11 in office, with normal findings.  Pregnancy remarkable for: Patient Active Problem List  Diagnoses  . hx of PIH in last 2 preg's  . BMI 45/ needs early glucola  . Hx of CT  . Anemia  . Tubal ligation evaluation     Chief Complaint  Patient presents with  . Fall     OB History    Grav Para Term Preterm Abortions TAB SAB Ect Mult Living   3 2 2  0 0 0 0 0 0 2      Past Medical History  Diagnosis Date  . Chlamydia 4 years ago  . Anemia   . Hypertension     HTN during pregnancy  . Urinary tract infection     Past Surgical History  Procedure Date  . No past surgeries     Family History  Problem Relation Age of Onset  . Seizures Father   . Hypertension Maternal Grandmother   . Diabetes Maternal Grandmother   . Hyperthyroidism Maternal Grandmother   . Hypertension Maternal Grandfather   . Cancer Maternal Grandfather     ?type  . Anesthesia problems Neg Hx     History  Substance Use Topics  . Smoking status: Never Smoker   . Smokeless tobacco: Never Used  . Alcohol Use: No    Allergies: No Known Allergies  Prescriptions prior to admission  Medication Sig Dispense Refill  . acetaminophen (TYLENOL) 500 MG tablet Take 1,000 mg by mouth every 6 (six) hours as needed. headaches      . Iron Combinations (IRON COMPLEX PO) Take by mouth once.      . Prenatal Vit-Fe Fumarate-FA (PRENATAL MULTIVITAMIN) TABS Take 1 tablet by mouth daily.         Physical Exam   Blood pressure 132/82, pulse 80, temperature 98.9 F (37.2 C), temperature source Oral, resp. rate 20, height 5\' 6"  (1.676 m), weight 285 lb (129.275 kg), last menstrual period  11/16/2010.  Chest clear  Heart RRR without murmur Abd gravid, NT, no obvious trauma noted Pelvic--deferred Ext WNL, no evidence trauma  FHR reactive, no decels No contractions.  ED Course  IUPat 33 1/7 weeks S/P fall O+ type  Plan: Consulted with Dr. Stefano Gaul. Plan 4 hour observation--if no UC activity, may d/c home then.  If contracting, will admit for overnight observation. Limited OB US for evaluation of placenta, cervix, and fluid. UA  Nigel Bridgeman, CNM, [place] 08/04/11 6:40pm  Addendum: limited US WNL, placenta anterior, fluid nl,  FHR 130 reactive cat 1, no decels toco - occ ctx Pt denies any pain or ctx, ready for D/C DC'd home in stable cond F/u office as sched next week FKC and PTL sx's rv'd

## 2011-08-04 NOTE — MAU Note (Signed)
Tripped over son around 1500. Started cramping. Denies bleeding or leaking.

## 2011-08-09 ENCOUNTER — Ambulatory Visit (INDEPENDENT_AMBULATORY_CARE_PROVIDER_SITE_OTHER): Payer: 59 | Admitting: Obstetrics and Gynecology

## 2011-08-09 ENCOUNTER — Ambulatory Visit (INDEPENDENT_AMBULATORY_CARE_PROVIDER_SITE_OTHER): Payer: 59

## 2011-08-09 VITALS — BP 124/72 | Wt 285.0 lb

## 2011-08-09 DIAGNOSIS — Z3490 Encounter for supervision of normal pregnancy, unspecified, unspecified trimester: Secondary | ICD-10-CM

## 2011-08-09 DIAGNOSIS — E669 Obesity, unspecified: Secondary | ICD-10-CM

## 2011-08-09 DIAGNOSIS — Z331 Pregnant state, incidental: Secondary | ICD-10-CM

## 2011-08-09 DIAGNOSIS — O365990 Maternal care for other known or suspected poor fetal growth, unspecified trimester, not applicable or unspecified: Secondary | ICD-10-CM

## 2011-08-09 NOTE — Progress Notes (Signed)
C/o mucousy d/c, denies irritation or LOF Spec -no abnl d/c, ph 4.0, -N, -P Ultrasound today (for S>D) estimated fetal weight 5 lbs 11 oz. It may not percentile, normal fluid, cervix 3.2 cm, BPP 8/8, vertex FKCs and Labor Precautions RTO 2wks

## 2011-08-11 LAB — US OB FOLLOW UP

## 2011-08-18 ENCOUNTER — Encounter (HOSPITAL_COMMUNITY): Payer: Self-pay

## 2011-08-18 ENCOUNTER — Inpatient Hospital Stay (HOSPITAL_COMMUNITY)
Admission: AD | Admit: 2011-08-18 | Discharge: 2011-08-18 | Disposition: A | Discharge status: Home / Self Care | Payer: 59 | Source: Ambulatory Visit | Attending: Obstetrics and Gynecology | Admitting: Obstetrics and Gynecology

## 2011-08-18 ENCOUNTER — Telehealth: Payer: Self-pay | Admitting: Obstetrics and Gynecology

## 2011-08-18 DIAGNOSIS — R51 Headache: Secondary | ICD-10-CM | POA: Insufficient documentation

## 2011-08-18 DIAGNOSIS — Z01818 Encounter for other preprocedural examination: Secondary | ICD-10-CM

## 2011-08-18 DIAGNOSIS — O139 Gestational [pregnancy-induced] hypertension without significant proteinuria, unspecified trimester: Secondary | ICD-10-CM

## 2011-08-18 DIAGNOSIS — E669 Obesity, unspecified: Secondary | ICD-10-CM | POA: Insufficient documentation

## 2011-08-18 DIAGNOSIS — R030 Elevated blood-pressure reading, without diagnosis of hypertension: Secondary | ICD-10-CM | POA: Insufficient documentation

## 2011-08-18 DIAGNOSIS — O99891 Other specified diseases and conditions complicating pregnancy: Secondary | ICD-10-CM | POA: Insufficient documentation

## 2011-08-18 LAB — URINE MICROSCOPIC-ADD ON

## 2011-08-18 LAB — CBC
MCHC: 31.4 g/dL (ref 30.0–36.0)
Platelets: 335 10*3/uL (ref 150–400)
RDW: 15.1 % (ref 11.5–15.5)

## 2011-08-18 LAB — URIC ACID: Uric Acid, Serum: 5.6 mg/dL (ref 2.4–7.0)

## 2011-08-18 LAB — COMPREHENSIVE METABOLIC PANEL
ALT: 11 U/L (ref 0–35)
AST: 13 U/L (ref 0–37)
Albumin: 2.7 g/dL — ABNORMAL LOW (ref 3.5–5.2)
Alkaline Phosphatase: 161 U/L — ABNORMAL HIGH (ref 39–117)
BUN: 4 mg/dL — ABNORMAL LOW (ref 6–23)
Potassium: 4.4 mEq/L (ref 3.5–5.1)
Sodium: 135 mEq/L (ref 135–145)
Total Protein: 7 g/dL (ref 6.0–8.3)

## 2011-08-18 LAB — URINALYSIS, ROUTINE W REFLEX MICROSCOPIC
Specific Gravity, Urine: 1.02 (ref 1.005–1.030)
Urobilinogen, UA: 0.2 mg/dL (ref 0.0–1.0)

## 2011-08-18 MED ORDER — ACETAMINOPHEN-CODEINE #3 300-30 MG PO TABS: 1.0000 | ORAL_TABLET | Freq: Four times a day (QID) | ORAL | Status: AC | PRN

## 2011-08-18 MED ORDER — OXYCODONE-ACETAMINOPHEN 5-325 MG PO TABS
1.0000 | ORAL_TABLET | Freq: Once | ORAL | Status: AC
  Administered 2011-08-18: 1 via ORAL
  Filled 2011-08-18: qty 1

## 2011-08-18 NOTE — Telephone Encounter (Signed)
PT CALLED, IS 35 1/7 WKS TODAY.  STATES FOR PAST 2 DAYS HAS NOTICED SWELLING IN HER HANDS AND FEET, HEADACHE THAT MINIMALLY GOES AWAY W/ TYLENOL, BUT STILL LINGERS, ALSO C/O VISUAL CHANGES IN HER LEFT EYE.  HAS HAD TO TAKE BP MEDS AROUND THIS TIME IN HER LAST 2 PREGNANCIES.  HAS A BP CUFF @ HOME, BP'S HAVE BEEN 149/50, 144/89, 150/85.  DOES HAVE +FM.  NEXT APPT FOR PT NOT UNTIL TUES 08/24/11.  PER [facility], PT TO MAU FOR EVAL.  PT VOICES UNDERSTANDING.

## 2011-08-18 NOTE — Telephone Encounter (Signed)
Keshia/epic/ob

## 2011-08-18 NOTE — MAU Provider Note (Signed)
History   Connie Taylor is a 26 y.o BF at [redacted]w[redacted]d who presents for PIH eval w/ CC of recurring and persisting Lt frontal headache, floaters in vision when turns head from side to side, swelling in hands, and elevated BP 140s-150s/80s on home BP cuff.  Pt called office this morning to report these symptoms.  Next appt Tues next week.  Did baseline 24 hr urine=63mg  total protein earlier in pregnancy secondary to hx.  Pt reports she doesn't really have headaches except when she's pregnant.  She took "two equate brand acetaminophen" tablets around 0830 this morning for headache.  Ate breakfast.  No LUQ or epigastric pain.  No GI or resp c/o's.  GFM.  No ctxs.  No recent illness or fever.  No LOF or VB.  Pregnancy r/f:  Patient Active Problem List  Diagnosis  . hx of PIH in last 2 preg's  . BMI 45/ needs early glucola  . Hx of CT  . Anemia  . Tubal ligation evaluation    CSN: [id number]  Arrival date and time: 08/18/11 1017   First Provider Initiated Contact with Patient 08/18/11 1035      Chief Complaint  Patient presents with  . Headache  . Hypertension   HPI  OB History    Grav Para Term Preterm Abortions TAB SAB Ect Mult Living   3 2 2  0 0 0 0 0 0 2      Past Medical History  Diagnosis Date  . Chlamydia 4 years ago  . Anemia   . Hypertension     HTN during pregnancy  . Urinary tract infection     Past Surgical History  Procedure Date  . No past surgeries     Family History  Problem Relation Age of Onset  . Seizures Father   . Hypertension Maternal Grandmother   . Diabetes Maternal Grandmother   . Hyperthyroidism Maternal Grandmother   . Hypertension Maternal Grandfather   . Cancer Maternal Grandfather     ?type  . Anesthesia problems Neg Hx     History  Substance Use Topics  . Smoking status: Never Smoker   . Smokeless tobacco: Never Used  . Alcohol Use: No    Allergies: No Known Allergies  Prescriptions prior to admission  Medication Sig Dispense  Refill  . acetaminophen (TYLENOL) 500 MG tablet Take 1,000 mg by mouth every 6 (six) hours as needed. headaches      . Iron Combinations (IRON COMPLEX PO) Take by mouth once.      . Prenatal Vit-Fe Fumarate-FA (PRENATAL MULTIVITAMIN) TABS Take 1 tablet by mouth daily.        ROS--see history above Physical Exam   Height 5\' 5"  (1.651 m), weight 286 lb 2 oz (129.785 kg), last menstrual period 11/16/2010. .. Filed Vitals:   08/18/11 1037 08/18/11 1046 08/18/11 1101 08/18/11 1116  BP: 142/70 149/83 153/81 145/80  Pulse: 96 94 99 93  Temp: 98.4 F (36.9 C)     TempSrc: Oral     Resp: 18 20    Height:      Weight:      .Marland Kitchen Results for orders placed during the hospital encounter of 08/18/11 (from the past 24 hour(s))  URINALYSIS, ROUTINE W REFLEX MICROSCOPIC     Status: Abnormal   Collection Time   08/18/11 10:22 AM      Component Value Range   Color, Urine YELLOW  YELLOW   APPearance CLEAR  CLEAR   Specific Gravity, Urine  1.020  1.005 - 1.030   pH 7.0  5.0 - 8.0   Glucose, UA NEGATIVE  NEGATIVE mg/dL   Hgb urine dipstick TRACE (*) NEGATIVE   Bilirubin Urine NEGATIVE  NEGATIVE   Ketones, ur NEGATIVE  NEGATIVE mg/dL   Protein, ur NEGATIVE  NEGATIVE mg/dL   Urobilinogen, UA 0.2  0.0 - 1.0 mg/dL   Nitrite NEGATIVE  NEGATIVE   Leukocytes, UA TRACE (*) NEGATIVE  URINE MICROSCOPIC-ADD ON     Status: Abnormal   Collection Time   08/18/11 10:22 AM      Component Value Range   Squamous Epithelial / LPF MANY (*) RARE   WBC, UA 0-2  <3 WBC/hpf   RBC / HPF 0-2  <3 RBC/hpf   Bacteria, UA FEW (*) RARE  CBC     Status: Abnormal   Collection Time   08/18/11 10:50 AM      Component Value Range   WBC 8.2  4.0 - 10.5 K/uL   RBC 4.69  3.87 - 5.11 MIL/uL   Hemoglobin 11.1 (*) 12.0 - 15.0 g/dL   HCT 16.1 (*) 09.6 - 04.5 %   MCV 75.3 (*) 78.0 - 100.0 fL   MCH 23.7 (*) 26.0 - 34.0 pg   MCHC 31.4  30.0 - 36.0 g/dL   RDW 40.9  81.1 - 91.4 %   Platelets 335  150 - 400 K/uL  COMPREHENSIVE  METABOLIC PANEL     Status: Abnormal   Collection Time   08/18/11 10:50 AM      Component Value Range   Sodium 135  135 - 145 mEq/L   Potassium 4.4  3.5 - 5.1 mEq/L   Chloride 101  96 - 112 mEq/L   CO2 24  19 - 32 mEq/L   Glucose, Bld 77  70 - 99 mg/dL   BUN 4 (*) 6 - 23 mg/dL   Creatinine, Ser 7.82  0.50 - 1.10 mg/dL   Calcium 9.9  8.4 - 95.6 mg/dL   Total Protein 7.0  6.0 - 8.3 g/dL   Albumin 2.7 (*) 3.5 - 5.2 g/dL   AST 13  0 - 37 U/L   ALT 11  0 - 35 U/L   Alkaline Phosphatase 161 (*) 39 - 117 U/L   Total Bilirubin 0.3  0.3 - 1.2 mg/dL   GFR calc non Af Amer >90  >90 mL/min   GFR calc Af Amer >90  >90 mL/min  URIC ACID     Status: Normal   Collection Time   08/18/11 10:50 AM      Component Value Range   Uric Acid, Serum 5.6  2.4 - 7.0 mg/dL  LACTATE DEHYDROGENASE     Status: Normal   Collection Time   08/18/11 10:50 AM      Component Value Range   LDH 142  94 - 250 U/L   Physical Exam  Constitutional: She is oriented to person, place, and time. She appears well-developed and well-nourished. No distress.  HENT:  Head: Normocephalic.  Eyes: Pupils are equal, round, and reactive to light.  Cardiovascular: Normal rate.   Respiratory: Effort normal.  GI: Soft.       gravid  Musculoskeletal: She exhibits edema.       Hands only; none in LE's  Neurological: She is alert and oriented to person, place, and time. She displays abnormal reflex.       No clonus; DTR's brisk, 3+ BLE  Skin: Skin is warm and dry.  Psychiatric: She has a normal mood and affect. Her behavior is normal. Thought content normal.  EFM:  145, reactive, moderate variability, no decels TOCO:  No discernable ctxs  MAU Course  Procedures 1.  PIH labs 2.  Serial BP's 3.  U/a 4.  Percocet one tab po x1  Assessment and Plan  1.  [redacted]w[redacted]d 2.  Elevated systolic BP's 3.  H/o PIH w/ 2 previous pregnancies 4.  nml PIH labs except low BUN =4 5.  Reactive NST 6.  HA improved w/ one percocet 7. obese  1.   D/c home w/ PIH precautions per c/w Dr. Stefano Gaul; [facility] 2.  Will defer antihypertensive at this time.  3.  Pt will complete 24 hr urine today and bring to office tomorrow to drop off and have BP check. 4.  F/u next Tuesday or prn 5.  Rx given for Tylenol #3 #30 w/ 1 RF 6.  Other comfort measures disc'd  Jelissa Espiritu H 08/18/2011, 10:42 AM

## 2011-08-18 NOTE — Progress Notes (Signed)
Connie Taylor cnm notified of patient current status, bp readings and that all her labs are resulted. She will come to mau to evaluate patient.

## 2011-08-18 NOTE — Discharge Instructions (Signed)
Hypertension During Pregnancy Hypertension is also called high blood pressure. It can occur at any time in life and during pregnancy. When you have hypertension, there is extra pressure inside your blood vessels that carry blood from the heart to the rest of your body (arteries). Hypertension during pregnancy can cause problems for you and your baby. Your baby might not weigh as much as it should at birth or might be born early (premature). Very bad cases of hypertension during pregnancy can be life-threatening.  There are different types of hypertension during pregnancy.   Chronic hypertension. This happens when a woman has hypertension before pregnancy and it continues during pregnancy.   Gestational hypertension. This is when hypertension develops during pregnancy.   Preeclampsia or toxemia of pregnancy. This is a very serious type of hypertension that develops only during pregnancy. It is a disease that affects the whole body (systemic) and can be very dangerous for both mother and baby.   Gestational hypertension and preeclampsia usually go away after your baby is born. Blood pressure generally stabilizes within 6 weeks. Women who have hypertension during pregnancy have a greater chance of developing hypertension later in life or with future pregnancies. UNDERSTANDING BLOOD PRESSURE Blood pressure moves blood in your body. Sometimes, the force that moves the blood becomes too strong.  A blood pressure reading is given in 2 numbers and looks like a fraction.   The top number is called the systolic pressure. When your heart beats, it forces more blood to flow through the arteries. Pressure inside the arteries goes up.   The bottom number is the diastolic pressure. Pressure goes down between beats. That is when the heart is resting.   You may have hypertension if:   Your systolic blood pressure is above 140.   Your diastolic pressure is above 90.  RISK FACTORS Some factors make you more  likely to develop hypertension during pregnancy. Risk factors include:  Having hypertension before pregnancy.   Having hypertension during a previous pregnancy.   Being overweight.   Being older than 40.   Being pregnant with more than 1 baby (multiples).   Having diabetes or kidney problems.  SYMPTOMS Chronic and gestational hypertension may not cause symptoms. Preeclampsia has symptoms, which may include:  Increased protein in your urine. Your caregiver will check for this at every prenatal visit.   Swelling of your hands and face.   Rapid weight gain.   Headaches.   Visual changes.   Being bothered by light.   Abdominal pain, especially in the right upper area.   Chest pain.   Shortness of breath.   Increased reflexes.   Seizures. Seizures occur with a more severe form of preeclampsia, called eclampsia.  DIAGNOSIS   You may be diagnosed with hypertension during pregnancy during a regular prenatal exam. At each visit, tests may include:   Blood pressure checks.   A urine test to check for protein in your urine.   The type of hypertension you are diagnosed with depends on when you developed it. It also depends on your specific blood pressure reading.   Developing hypertension before 20 weeks of pregnancy is consistent with chronic hypertension.   Developing hypertension after 20 weeks of pregnancy is consistent with gestational hypertension.   Hypertension with increased urinary protein is diagnosed as preeclampsia.   Blood pressure measurements that stay above 160 systolic or 110 diastolic are a sign of severe preeclampsia.  TREATMENT Treatment for hypertension during pregnancy varies. Treatment depends on   the type of hypertension and how serious it is.  If you take medicine for chronic hypertension, you may need to switch medicines.   Drugs called ACE inhibitors should not be taken during pregnancy.   Low-dose aspirin may be suggested for women who have  risk factors for preeclampsia.   If you have gestational hypertension, you may need to take a blood pressure medicine that is safe during pregnancy. Your caregiver will recommend the appropriate medicine.   If you have severe preeclampsia, you may need to be in the hospital. Caregivers will watch you and the baby very closely. You also may need to take medicine (magnesium sulfate) to prevent seizures and lower blood pressure.   Sometimes an early delivery is needed. This may be the case if the condition worsens. It would be done to protect you and the baby. The only cure for preeclampsia is delivery.  HOME CARE INSTRUCTIONS  Schedule and keep all of your regular prenatal care.   Follow your caregiver's instructions for taking medicines. Tell your caregiver about all medicines you take. This includes over-the-counter medicines.   Eat as little salt as possible.   Get regular exercise.   Do not drink alcohol.   Do not use tobacco products.   Do not drink products with caffeine.   Lie on your left side when resting.   Tell your doctor if you have any preeclampsia symptoms.  SEEK IMMEDIATE MEDICAL CARE IF:  You have severe abdominal pain.   You have sudden swelling in the hands, ankles, or face.   You gain 4 pounds (1.8 kg) or more in 1 week.   You vomit repeatedly.   You have vaginal bleeding.   You do not feel the baby moving as much.   You have a headache.   You have blurred or double vision.   You have muscle twitching or spasms.   You have shortness of breath.   You have blue fingernails and lips.   You have blood in your urine.  MAKE SURE YOU:  Understand these instructions.   Will watch your condition.   Will get help right away if you are not doing well.  Document Released: 11/03/2010 Document Revised: 02/04/2011 Document Reviewed: 11/03/2010 [facility] Patient Information 2012 [place], [place]. Fetal Movement Counts Patient Name:  __________________________________________________ Patient Due Date: ____________________ Melody Haver counts is highly recommended in high risk pregnancies, but it is a good idea for every pregnant woman to do. Start counting fetal movements at 28 weeks of the pregnancy. Fetal movements increase after eating a full meal or eating or drinking something sweet (the blood sugar is higher). It is also important to drink plenty of fluids (well hydrated) before doing the count. Lie on your left side because it helps with the circulation or you can sit in a comfortable chair with your arms over your belly (abdomen) with no distractions around you. DOING THE COUNT  Try to do the count the same time of day each time you do it.   Mark the day and time, then see how long it takes for you to feel 10 movements (kicks, flutters, swishes, rolls). You should have at least 10 movements within 2 hours. You will most likely feel 10 movements in much less than 2 hours. If you do not, wait an hour and count again. After a couple of days you will see a pattern.   What you are looking for is a change in the pattern or not enough counts in 2  hours. Is it taking longer in time to reach 10 movements?  SEEK MEDICAL CARE IF:  You feel less than 10 counts in 2 hours. Tried twice.   No movement in one hour.   The pattern is changing or taking longer each day to reach 10 counts in 2 hours.   You feel the baby is not moving as it usually does.  Date: ____________ Movements: ____________ Start time: ____________ [name] time: ____________  Date: ____________ Movements: ____________ Start time: ____________ [name] time: ____________ Date: ____________ Movements: ____________ Start time: ____________ [name] time: ____________ Date: ____________ Movements: ____________ Start time: ____________ [name] time: ____________ Date: ____________ Movements: ____________ Start time: ____________ [name] time: ____________ Date: ____________  Movements: ____________ Start time: ____________ [name] time: ____________ Date: ____________ Movements: ____________ Start time: ____________ [name] time: ____________ Date: ____________ Movements: ____________ Start time: ____________ [name] time: ____________  Date: ____________ Movements: ____________ Start time: ____________ [name] time: ____________ Date: ____________ Movements: ____________ Start time: ____________ [name] time: ____________ Date: ____________ Movements: ____________ Start time: ____________ [name] time: ____________ Date: ____________ Movements: ____________ Start time: ____________ [name] time: ____________ Date: ____________ Movements: ____________ Start time: ____________ [name] time: ____________ Date: ____________ Movements: ____________ Start time: ____________ [name] time: ____________ Date: ____________ Movements: ____________ Start time: ____________ [name] time: ____________  Date: ____________ Movements: ____________ Start time: ____________ [name] time: ____________ Date: ____________ Movements: ____________ Start time: ____________ [name] time: ____________ Date: ____________ Movements: ____________ Start time: ____________ [name] time: ____________ Date: ____________ Movements: ____________ Start time: ____________ [name] time: ____________ Date: ____________ Movements: ____________ Start time: ____________ [name] time: ____________ Date: ____________ Movements: ____________ Start time: ____________ [name] time: ____________ Date: ____________ Movements: ____________ Start time: ____________ [name] time: ____________  Date: ____________ Movements: ____________ Start time: ____________ [name] time: ____________ Date: ____________ Movements: ____________ Start time: ____________ [name] time: ____________ Document Released: 03/17/2006 Document Revised: 02/04/2011 Document Reviewed: 09/17/2008 [employer] Patient Information 2012 [employer].

## 2011-08-18 NOTE — MAU Note (Signed)
Patient is here with c/o elevated bp and blurred vision when she turns her head. She states that she had elevated bp with 2 other pregnancies. She denies any epigatric pain, vaginal bleeding or lof. Reports good fetal move,ent.

## 2011-08-19 ENCOUNTER — Other Ambulatory Visit: Payer: Self-pay | Admitting: Obstetrics and Gynecology

## 2011-08-19 ENCOUNTER — Ambulatory Visit (INDEPENDENT_AMBULATORY_CARE_PROVIDER_SITE_OTHER): Payer: 59 | Admitting: Obstetrics and Gynecology

## 2011-08-19 ENCOUNTER — Other Ambulatory Visit: Payer: 59

## 2011-08-19 VITALS — BP 158/96

## 2011-08-19 DIAGNOSIS — O139 Gestational [pregnancy-induced] hypertension without significant proteinuria, unspecified trimester: Secondary | ICD-10-CM | POA: Insufficient documentation

## 2011-08-19 LAB — PROTEIN, URINE, RANDOM: Total Protein, Urine: 7 mg/dL

## 2011-08-19 LAB — CREATININE, URINE, 24 HOUR: Creatinine, 24H Ur: 1809 mg/d — ABNORMAL HIGH (ref 700–1800)

## 2011-08-19 MED ORDER — LABETALOL HCL 100 MG PO TABS: 200.0000 mg | ORAL_TABLET | Freq: Two times a day (BID) | ORAL | Status: DC

## 2011-08-19 NOTE — Progress Notes (Signed)
Pt here to drop off 24 hr urine and BP check.  Rpt bp was 152/96 PIH labs were reviewed from 6/19/ 2013 and found to be normal The patient's 24 hour urine specimen is sent stat. Fetal heart tones are 150-190 because of acceleration The patient had a reactive nonstress test on August 18, 2011 at [facility] Impression: Chronic hypertension with late pregnancy exacerbation Rule out bowel to preeclampsia  Recommendation: The patient wishes to continue working but will rest as much as possible when she returns from work She will be started on labetalol 200 mg p.o. B.i.d. She will return for followup on August 24, 2011

## 2011-08-19 NOTE — Patient Instructions (Signed)
Rest as much as possible,

## 2011-08-23 ENCOUNTER — Telehealth: Payer: Self-pay | Admitting: Obstetrics and Gynecology

## 2011-08-23 NOTE — Telephone Encounter (Signed)
TC from pt.   States is having white cottage cheese-like vaginal D/C.  Questioning what she can use OTC.  Has appt. 08/24/11.   Suggested Monistat externally so D/C can be tested at visit. Pt verbalizes comprehension. +FM   Denies any other problems/

## 2011-08-23 NOTE — Telephone Encounter (Signed)
Triage/ob/general quest.

## 2011-08-24 ENCOUNTER — Encounter: Payer: Self-pay | Admitting: Obstetrics and Gynecology

## 2011-08-24 ENCOUNTER — Ambulatory Visit (INDEPENDENT_AMBULATORY_CARE_PROVIDER_SITE_OTHER): Payer: 59 | Admitting: Obstetrics and Gynecology

## 2011-08-24 ENCOUNTER — Ambulatory Visit (INDEPENDENT_AMBULATORY_CARE_PROVIDER_SITE_OTHER): Payer: 59

## 2011-08-24 VITALS — BP 154/100 | Wt 278.0 lb

## 2011-08-24 DIAGNOSIS — IMO0002 Reserved for concepts with insufficient information to code with codable children: Secondary | ICD-10-CM

## 2011-08-24 DIAGNOSIS — O139 Gestational [pregnancy-induced] hypertension without significant proteinuria, unspecified trimester: Secondary | ICD-10-CM

## 2011-08-24 DIAGNOSIS — N898 Other specified noninflammatory disorders of vagina: Secondary | ICD-10-CM

## 2011-08-24 DIAGNOSIS — Z331 Pregnant state, incidental: Secondary | ICD-10-CM

## 2011-08-24 LAB — POCT WET PREP (WET MOUNT)

## 2011-08-24 NOTE — Progress Notes (Signed)
Pt c/o headaches every day occ blurred vision no dizziness no swelling some relief with tylenol

## 2011-08-24 NOTE — Progress Notes (Signed)
The patient complains of a headache which she has had the entire pregnancy.  She has occasional blurred vision.  She denies right upper quadrant tenderness.  Her reflexes are normal.  There is no clonus.  Her 24-hour urine protein was 7 mg per 24 hours.  She is currently taking 200 mg of labetalol twice each day.  Preeclampsia discussed.  We will increase her labetalol to 300 mg twice each day.  Ultrasound: Single gestation, normal fluid, vertex, BPP 8 out of 8.  Repeat blood pressure in 2-3 days. Repeat PIH labs in 2-3 days.  Repeat biophysical profile in one week.  Dr. Stefano Gaul

## 2011-08-24 NOTE — Progress Notes (Signed)
The patient complains of vaginal irritation.  Wet prep is negative. Beta strep, GC, and Chlamydia sent. Dr. Stefano Gaul

## 2011-08-26 ENCOUNTER — Telehealth: Payer: Self-pay | Admitting: Obstetrics and Gynecology

## 2011-08-26 ENCOUNTER — Encounter (HOSPITAL_COMMUNITY): Payer: Self-pay | Admitting: *Deleted

## 2011-08-26 ENCOUNTER — Inpatient Hospital Stay (HOSPITAL_COMMUNITY)
Admission: AD | Admit: 2011-08-26 | Discharge: 2011-08-26 | Disposition: A | Discharge status: Home / Self Care | Payer: 59 | Source: Ambulatory Visit | Attending: Obstetrics and Gynecology | Admitting: Obstetrics and Gynecology

## 2011-08-26 ENCOUNTER — Other Ambulatory Visit: Payer: Self-pay | Admitting: Obstetrics and Gynecology

## 2011-08-26 DIAGNOSIS — R51 Headache: Secondary | ICD-10-CM | POA: Insufficient documentation

## 2011-08-26 DIAGNOSIS — O139 Gestational [pregnancy-induced] hypertension without significant proteinuria, unspecified trimester: Secondary | ICD-10-CM | POA: Insufficient documentation

## 2011-08-26 LAB — URINALYSIS, ROUTINE W REFLEX MICROSCOPIC
Ketones, ur: NEGATIVE mg/dL
Nitrite: NEGATIVE
Specific Gravity, Urine: 1.015 (ref 1.005–1.030)
pH: 7 (ref 5.0–8.0)

## 2011-08-26 LAB — CBC
MCH: 23.9 pg — ABNORMAL LOW (ref 26.0–34.0)
MCHC: 32 g/dL (ref 30.0–36.0)
MCV: 74.7 fL — ABNORMAL LOW (ref 78.0–100.0)
Platelets: 347 10*3/uL (ref 150–400)
RDW: 15 % (ref 11.5–15.5)

## 2011-08-26 LAB — COMPREHENSIVE METABOLIC PANEL
AST: 18 U/L (ref 0–37)
Albumin: 2.8 g/dL — ABNORMAL LOW (ref 3.5–5.2)
Calcium: 9 mg/dL (ref 8.4–10.5)
Creatinine, Ser: 0.56 mg/dL (ref 0.50–1.10)
GFR calc non Af Amer: >90 mL/min (ref >90)
Total Protein: 6.9 g/dL (ref 6.0–8.3)

## 2011-08-26 LAB — URINE MICROSCOPIC-ADD ON

## 2011-08-26 LAB — URIC ACID: Uric Acid, Serum: 6.2 mg/dL (ref 2.4–7.0)

## 2011-08-26 MED ORDER — LABETALOL HCL 100 MG PO TABS: 400.0000 mg | ORAL_TABLET | Freq: Two times a day (BID) | ORAL | Status: DC

## 2011-08-26 MED ORDER — BUTORPHANOL TARTRATE 2 MG/ML IJ SOLN
1.0000 mg | INTRAMUSCULAR | Status: DC | PRN
  Administered 2011-08-26: 1 mg via INTRAVENOUS
  Filled 2011-08-26: qty 1

## 2011-08-26 MED ORDER — PROMETHAZINE HCL 25 MG/ML IJ SOLN
12.5000 mg | INTRAMUSCULAR | Status: DC | PRN
  Administered 2011-08-26: 12.5 mg via INTRAVENOUS
  Filled 2011-08-26: qty 1

## 2011-08-26 MED ORDER — LABETALOL HCL 5 MG/ML IV SOLN
20.0000 mg | INTRAVENOUS | Status: AC | PRN
  Administered 2011-08-26 (×2): 20 mg via INTRAVENOUS
  Filled 2011-08-26 (×2): qty 4

## 2011-08-26 MED ORDER — HYDROCODONE-ACETAMINOPHEN 5-500 MG PO TABS: 1.0000 | ORAL_TABLET | Freq: Four times a day (QID) | ORAL | Status: AC | PRN

## 2011-08-26 MED ORDER — LACTATED RINGERS IV SOLN
INTRAVENOUS | Status: DC
  Administered 2011-08-26: 09:00:00 via INTRAVENOUS

## 2011-08-26 NOTE — Progress Notes (Signed)
CNM informed of latest BP reading, 148/113, instructed to go ahead with labetalol dose.

## 2011-08-26 NOTE — Telephone Encounter (Signed)
Come to [facility] for evaluation with serial bp, agrees to come now. Lavera Guise, CNM

## 2011-08-26 NOTE — Progress Notes (Signed)
Appt scheduled for lab and BP check 08/27/11 early afternoon per [facility] staff message.

## 2011-08-26 NOTE — MAU Provider Note (Signed)
History   26 yo [id number] at 90 2/7 weeks presented c/o severe HA last 24 hours--on Labetalol 300 mg po BID since office visit 08/24/11.  Had been on 200 mg po BID prior to that visit.  Has struggled with HA throughout pregnancy.  Reports nausea today--vomited x 1 yesterday.  "Sees spots" when she turns head to right, but not when turning to left.  Denies epigastric pain.  HA is frontal, with photophobia.  Reports occasional contractions and positive FM.  Denies leaking or bleeding.  Had evaluation for HA as early as 14 weeks.  Pregnancy remarkable for: Patient Active Problem List  Diagnosis  . hx of PIH in last 2 preg's  . BMI 45/ needs early glucola  . Hx of CT  . Anemia  . Tubal ligation evaluation  . PIH (pregnancy induced hypertension), antepartum  Has had BP issues with both previous pregnancies, but issues resolved after each delivery.   Had 24 hour urine on 08/19/11 = 7.  Last PIH w/u 08/18/11, with normal labs. Started on Labetalol 200 mg po BID on 08/19/11.   Chief Complaint  Patient presents with  . Headache     OB History    Grav Para Term Preterm Abortions TAB SAB Ect Mult Living   3 2 2  0 0 0 0 0 0 2      Past Medical History  Diagnosis Date  . Chlamydia 4 years ago  . Anemia   . Hypertension     HTN during pregnancy  . Urinary tract infection     Past Surgical History  Procedure Date  . No past surgeries     Family History  Problem Relation Age of Onset  . Seizures Father   . Hypertension Maternal Grandmother   . Diabetes Maternal Grandmother   . Hyperthyroidism Maternal Grandmother   . Hypertension Maternal Grandfather   . Cancer Maternal Grandfather     ?type  . Anesthesia problems Neg Hx     History  Substance Use Topics  . Smoking status: Never Smoker   . Smokeless tobacco: Never Used  . Alcohol Use: No    Allergies: No Known Allergies  Prescriptions prior to admission  Medication Sig Dispense Refill  . acetaminophen-codeine (TYLENOL  #3) 300-30 MG per tablet Take 1-2 tablets by mouth every 6 (six) hours as needed for pain (headache).  30 tablet  1  . Iron Combinations (IRON COMPLEX PO) Take by mouth once.      . labetalol (NORMODYNE) 100 MG tablet Take 2 tablets (200 mg total) by mouth 2 (two) times daily.  60 tablet  3  . Prenatal Vit-Fe Fumarate-FA (PRENATAL MULTIVITAMIN) TABS Take 1 tablet by mouth daily.         Physical Exam   Last menstrual period 11/16/2010.  Chest clear Heart RRR without murmur Abd gravid, NT Pelvic--deferred Ext--DTR 2+ without clonus, 1+ edema   ED Course  IUP at 36 2/[redacted] weeks Gestational hypertension HA  Plan: Consulted with Dr. Su Hilt [facility] labs UA Labetalol 20 mg IV q 20 min prn x 2 doses IV hydration Stadol and Phenergan IV for HA. Re-evaluate after labs and meds.  Nigel Bridgeman, CNM, [place]    Addendum: Received Labetalol 20 mg IV x 2 doses (had held it initially due to BP 112/78 just after orders received from Dr. Su Hilt for Labetalol), then BP returned to 140s-160s/80s-90s. Received Stadol and Phenergan IV, with resolution of HA.  Mild at present. No scotomata.  Filed Vitals:  08/26/11 1046 08/26/11 1104 08/26/11 1116 08/26/11 1131  BP: 138/85 155/81 132/69 141/92  Pulse: 92 79 93 90   Results for orders placed during the hospital encounter of 08/26/11 (from the past 24 hour(s))  CBC     Status: Abnormal   Collection Time   08/26/11  8:15 AM      Component Value Range   WBC 9.9  4.0 - 10.5 K/uL   RBC 4.90  3.87 - 5.11 MIL/uL   Hemoglobin 11.7 (*) 12.0 - 15.0 g/dL   HCT 40.9  81.1 - 91.4 %   MCV 74.7 (*) 78.0 - 100.0 fL   MCH 23.9 (*) 26.0 - 34.0 pg   MCHC 32.0  30.0 - 36.0 g/dL   RDW 78.2  95.6 - 21.3 %   Platelets 347  150 - 400 K/uL  COMPREHENSIVE METABOLIC PANEL     Status: Abnormal   Collection Time   08/26/11  8:15 AM      Component Value Range   Sodium 136  135 - 145 mEq/L   Potassium 3.9  3.5 - 5.1 mEq/L   Chloride 101  96 - 112 mEq/L   CO2 23  19  - 32 mEq/L   Glucose, Bld 77  70 - 99 mg/dL   BUN 5 (*) 6 - 23 mg/dL   Creatinine, Ser 0.86  0.50 - 1.10 mg/dL   Calcium 9.0  8.4 - 57.8 mg/dL   Total Protein 6.9  6.0 - 8.3 g/dL   Albumin 2.8 (*) 3.5 - 5.2 g/dL   AST 18  0 - 37 U/L   ALT 11  0 - 35 U/L   Alkaline Phosphatase 176 (*) 39 - 117 U/L   Total Bilirubin 0.4  0.3 - 1.2 mg/dL   GFR calc non Af Amer >90  >90 mL/min   GFR calc Af Amer >90  >90 mL/min  LACTATE DEHYDROGENASE     Status: Normal   Collection Time   08/26/11  8:15 AM      Component Value Range   LDH 228  94 - 250 U/L  URIC ACID     Status: Normal   Collection Time   08/26/11  8:15 AM      Component Value Range   Uric Acid, Serum 6.2  2.4 - 7.0 mg/dL  URINALYSIS, ROUTINE W REFLEX MICROSCOPIC     Status: Abnormal   Collection Time   08/26/11  8:20 AM      Component Value Range   Color, Urine YELLOW  YELLOW   APPearance CLEAR  CLEAR   Specific Gravity, Urine 1.015  1.005 - 1.030   pH 7.0  5.0 - 8.0   Glucose, UA NEGATIVE  NEGATIVE mg/dL   Hgb urine dipstick TRACE (*) NEGATIVE   Bilirubin Urine NEGATIVE  NEGATIVE   Ketones, ur NEGATIVE  NEGATIVE mg/dL   Protein, ur NEGATIVE  NEGATIVE mg/dL   Urobilinogen, UA 1.0  0.0 - 1.0 mg/dL   Nitrite NEGATIVE  NEGATIVE   Leukocytes, UA SMALL (*) NEGATIVE  URINE MICROSCOPIC-ADD ON     Status: Abnormal   Collection Time   08/26/11  8:20 AM      Component Value Range   Squamous Epithelial / LPF FEW (*) RARE   WBC, UA 0-2  <3 WBC/hpf   RBC / HPF 0-2  <3 RBC/hpf   FHR reactive, no decels. No significant contractions.  Consulted with Dr. Su Hilt D/C'd home. To increase Labetalol to 400 mg po BID.  Rx Vicodin for HA. Start 24 hour urine today, to return to office 6/28, with BP check at that time. Gestational hypertension precautions reviewed. To call with any worsening of HA--she is also taking her BP at home, and is to report systolic > or = 165, diastolic > or = 100.  Nigel Bridgeman, CNM, [place] 6/287/13 12noon

## 2011-08-26 NOTE — MAU Note (Signed)
Pt has hx of chronic HTN, woke up this a.m. @ 0300 with severe HA, was seeing spots with L eye, denies epigastric pain.  Instructed to come to MAU for PIH eval.

## 2011-08-26 NOTE — Telephone Encounter (Signed)
TC to pt.  Informed of appts. 08/27/11.  Is agreeable.  +FM

## 2011-08-26 NOTE — Discharge Instructions (Signed)
24-Hour Urine Collection HOME CARE  When you get ready to start the 24 hour collection, empty your bladder and flush this urine. Make a note of the time. This will be your start time on the day of collection and the end time on the next day.  From then on, save all your pee (urine) in the plastic jug that was given to you.   You should stop collecting your pee 24 hours after you started.   If the plastic jug that is given to you already has liquid in it, that is okay. Do not throw out the liquid or rinse out the jug. Some tests need the liquid to be added to your pee.   Keep your plastic jug cool (in an ice chest or the refrigerator) during the test.   When the 24 hours is over, bring your plastic jug to [facility]. Keep the jug cool (in an ice chest) while you are bringing it to the lab.  Document Released: 05/14/2008 Document Revised: 02/04/2011 Document Reviewed: 05/14/2008 [facility] Patient Information 2012 [place], [place]  .Hypertension During Pregnancy Hypertension is also called high blood pressure. It can occur at any time in life and during pregnancy. When you have hypertension, there is extra pressure inside your blood vessels that carry blood from the heart to the rest of your body (arteries). Hypertension during pregnancy can cause problems for you and your baby. Your baby might not weigh as much as it should at birth or might be born early (premature). Very bad cases of hypertension during pregnancy can be life-threatening.  There are different types of hypertension during pregnancy.   Chronic hypertension. This happens when a woman has hypertension before pregnancy and it continues during pregnancy.   Gestational hypertension. This is when hypertension develops during pregnancy.   Preeclampsia or toxemia of pregnancy. This is a very serious type of hypertension that develops only during pregnancy. It is a disease that affects the whole body (systemic) and can be  very dangerous for both mother and baby.   Gestational hypertension and preeclampsia usually go away after your baby is born. Blood pressure generally stabilizes within 6 weeks. Women who have hypertension during pregnancy have a greater chance of developing hypertension later in life or with future pregnancies. UNDERSTANDING BLOOD PRESSURE Blood pressure moves blood in your body. Sometimes, the force that moves the blood becomes too strong.  A blood pressure reading is given in 2 numbers and looks like a fraction.   The top number is called the systolic pressure. When your heart beats, it forces more blood to flow through the arteries. Pressure inside the arteries goes up.   The bottom number is the diastolic pressure. Pressure goes down between beats. That is when the heart is resting.   You may have hypertension if:   Your systolic blood pressure is above 140.   Your diastolic pressure is above 90.  RISK FACTORS Some factors make you more likely to develop hypertension during pregnancy. Risk factors include:  Having hypertension before pregnancy.   Having hypertension during a previous pregnancy.   Being overweight.   Being older than 40.   Being pregnant with more than 1 baby (multiples).   Having diabetes or kidney problems.  SYMPTOMS Chronic and gestational hypertension may not cause symptoms. Preeclampsia has symptoms, which may include:  Increased protein in your urine. Your caregiver will check for this at every prenatal visit.   Swelling of your hands and face.   Rapid  weight gain.   Headaches.   Visual changes.   Being bothered by light.   Abdominal pain, especially in the right upper area.   Chest pain.   Shortness of breath.   Increased reflexes.   Seizures. Seizures occur with a more severe form of preeclampsia, called eclampsia.  DIAGNOSIS   You may be diagnosed with hypertension during pregnancy during a regular prenatal exam. At each visit,  tests may include:   Blood pressure checks.   A urine test to check for protein in your urine.   The type of hypertension you are diagnosed with depends on when you developed it. It also depends on your specific blood pressure reading.   Developing hypertension before 20 weeks of pregnancy is consistent with chronic hypertension.   Developing hypertension after 20 weeks of pregnancy is consistent with gestational hypertension.   Hypertension with increased urinary protein is diagnosed as preeclampsia.   Blood pressure measurements that stay above 160 systolic or 110 diastolic are a sign of severe preeclampsia.  TREATMENT Treatment for hypertension during pregnancy varies. Treatment depends on the type of hypertension and how serious it is.  If you take medicine for chronic hypertension, you may need to switch medicines.   Drugs called ACE inhibitors should not be taken during pregnancy.   Low-dose aspirin may be suggested for women who have risk factors for preeclampsia.   If you have gestational hypertension, you may need to take a blood pressure medicine that is safe during pregnancy. Your caregiver will recommend the appropriate medicine.   If you have severe preeclampsia, you may need to be in the hospital. Caregivers will watch you and the baby very closely. You also may need to take medicine (magnesium sulfate) to prevent seizures and lower blood pressure.   Sometimes an early delivery is needed. This may be the case if the condition worsens. It would be done to protect you and the baby. The only cure for preeclampsia is delivery.  HOME CARE INSTRUCTIONS  Schedule and keep all of your regular prenatal care.   Follow your caregiver's instructions for taking medicines. Tell your caregiver about all medicines you take. This includes over-the-counter medicines.   Eat as little salt as possible.   Get regular exercise.   Do not drink alcohol.   Do not use tobacco products.     Do not drink products with caffeine.   Lie on your left side when resting.   Tell your doctor if you have any preeclampsia symptoms.  SEEK IMMEDIATE MEDICAL CARE IF:  You have severe abdominal pain.   You have sudden swelling in the hands, ankles, or face.   You gain 4 pounds (1.8 kg) or more in 1 week.   You vomit repeatedly.   You have vaginal bleeding.   You do not feel the baby moving as much.   You have a headache.   You have blurred or double vision.   You have muscle twitching or spasms.   You have shortness of breath.   You have blue fingernails and lips.   You have blood in your urine.  MAKE SURE YOU:  Understand these instructions.   Will watch your condition.   Will get help right away if you are not doing well.  Document Released: 11/03/2010 Document Revised: 02/04/2011 Document Reviewed: 11/03/2010 [facility] Patient Information 2012 [place], [place].

## 2011-08-27 ENCOUNTER — Telehealth: Payer: Self-pay

## 2011-08-27 ENCOUNTER — Encounter: Payer: Self-pay | Admitting: Obstetrics and Gynecology

## 2011-08-27 ENCOUNTER — Other Ambulatory Visit: Payer: 59

## 2011-08-27 ENCOUNTER — Telehealth: Payer: Self-pay | Admitting: Obstetrics and Gynecology

## 2011-08-27 ENCOUNTER — Ambulatory Visit (INDEPENDENT_AMBULATORY_CARE_PROVIDER_SITE_OTHER): Payer: 59 | Admitting: Obstetrics and Gynecology

## 2011-08-27 VITALS — BP 168/100 | Wt 285.0 lb

## 2011-08-27 DIAGNOSIS — Z331 Pregnant state, incidental: Secondary | ICD-10-CM

## 2011-08-27 DIAGNOSIS — O139 Gestational [pregnancy-induced] hypertension without significant proteinuria, unspecified trimester: Secondary | ICD-10-CM

## 2011-08-27 NOTE — Telephone Encounter (Signed)
LM for pt to cb re scheduling of a BPP next week. Per AVS. Pt has appt on the 1st with Vance Gather, so it would be great if we could sched for that day. Melody Comas A

## 2011-08-27 NOTE — Progress Notes (Signed)
Pt without complaints Desires cervix check

## 2011-08-27 NOTE — Telephone Encounter (Signed)
Phoned pt to inform of 24hr urine results, total prot=7, total creat=2153, pt has no c/o, has appt on Monday

## 2011-08-28 LAB — CULTURE, BETA STREP (GROUP B ONLY)

## 2011-08-30 ENCOUNTER — Ambulatory Visit (INDEPENDENT_AMBULATORY_CARE_PROVIDER_SITE_OTHER): Payer: 59 | Admitting: Obstetrics and Gynecology

## 2011-08-30 ENCOUNTER — Encounter: Payer: Self-pay | Admitting: Obstetrics and Gynecology

## 2011-08-30 VITALS — BP 158/88 | Wt 288.0 lb

## 2011-08-30 DIAGNOSIS — IMO0002 Reserved for concepts with insufficient information to code with codable children: Secondary | ICD-10-CM

## 2011-08-30 DIAGNOSIS — O139 Gestational [pregnancy-induced] hypertension without significant proteinuria, unspecified trimester: Secondary | ICD-10-CM

## 2011-08-30 NOTE — Progress Notes (Signed)
Complains of headaches.

## 2011-08-30 NOTE — Patient Instructions (Signed)
Preeclampsia and Eclampsia Preeclampsia is a condition of high blood pressure during pregnancy. It can happen at 20 weeks or later in pregnancy. If high blood pressure occurs in the second half of pregnancy with no other symptoms, it is called gestational hypertension and goes away after the baby is born. If any of the symptoms listed below develop with gestational hypertension, it is then called preeclampsia. Eclampsia (convulsions) may follow preeclampsia. This is one of the reasons for regular prenatal checkups. Early diagnosis and treatment are very important to prevent eclampsia. CAUSES  There is no known cause of preeclampsia/eclampsia in pregnancy. There are several known conditions that may put the pregnant woman at risk, such as:  The first pregnancy.   Having preeclampsia in a past pregnancy.   Having lasting (chronic) high blood pressure.   Having multiples (twins, triplets).   Being age 70 or older.   African American ethnic background.   Having kidney disease or diabetes.   Medical conditions such as lupus or blood diseases.   Being overweight (obese).  SYMPTOMS   High blood pressure.   Headaches.   Sudden weight gain.   Swelling of hands, face, legs, and feet.   Protein in the urine.   Feeling sick to your stomach (nauseous) and throwing up (vomiting).   Vision problems (blurred or double vision).   Numbness in the face, arms, legs, and feet.   Dizziness.   Slurred speech.   Preeclampsia can cause growth retardation in the fetus.   Separation (abruption) of the placenta.   Not enough fluid in the amniotic sac (oligohydramnios).   Sensitivity to bright lights.   Belly (abdominal) pain.  DIAGNOSIS  If protein is found in the urine in the second half of pregnancy, this is considered preeclampsia. Other symptoms mentioned above may also be present. TREATMENT  It is necessary to treat this.  Your caregiver may prescribe bed rest early in this  condition. Plenty of rest and salt restriction may be all that is needed.   Medicines may be necessary to lower blood pressure if the condition does not respond to more conservative measures.   In more severe cases, hospitalization may be needed:   For treatment of blood pressure.   To control fluid retention.   To monitor the baby to see if the condition is causing harm to the baby.   Hospitalization is the best way to treat the first sign of preeclampsia. This is so the mother and baby can be watched closely and blood tests can be done effectively and correctly.   If the condition becomes severe, it may be necessary to induce labor or to remove the infant by surgical means (cesarean section). The best cure for preeclampsia/eclampsia is to deliver the baby.  Preeclampsia and eclampsia involve risks to mother and infant. Your caregiver will discuss these risks with you. Together, you can work out the best possible approach to your problems. Make sure you keep your prenatal visits as scheduled. Not keeping appointments could result in a chronic or permanent injury, pain, disability to you, and death or injury to you or your unborn baby. If there is any problem keeping the appointment, you must call to reschedule. HOME CARE INSTRUCTIONS   Keep your prenatal appointments and tests as scheduled.   Tell your caregiver if you have any of the above risk factors.   Get plenty of rest and sleep.   Eat a balanced diet that is low in salt, and do not add salt  to your food.   Avoid stressful situations.   Only take over-the-counter and prescriptions medicines for pain, discomfort, or fever as directed by your caregiver.  SEEK IMMEDIATE MEDICAL CARE IF:   You develop severe swelling anywhere in the body. This usually occurs in the legs.   You gain 5 lb/2.3 kg or more in a week.   You develop a severe headache, dizziness, problems with your vision, or confusion.   You have abdominal pain,  nausea, or vomiting.   You have a seizure.   You have trouble moving any part of your body, or you develop numbness or problems speaking.   You have bruising or abnormal bleeding from anywhere in the body.   You develop a stiff neck.   You pass out.  MAKE SURE YOU:   Understand these instructions.   Will watch your condition.   Will get help right away if you are not doing well or get worse.  Document Released: 02/13/2000 Document Revised: 02/04/2011 Document Reviewed: 09/29/2007 [facility] Patient Information 2012 [place], [place].Fetal Movement Taylor Patient Name: __________________________________________________ Patient Due Date: ____________________ Connie Taylor is highly recommended in high risk pregnancies, but it is a good idea for every pregnant woman to do. Start counting fetal movements at 28 weeks of the pregnancy. Fetal movements increase after eating a full meal or eating or drinking something sweet (the blood sugar is higher). It is also important to drink plenty of fluids (well hydrated) before doing the count. Lie on your left side because it helps with the circulation or you can sit in a comfortable chair with your arms over your belly (abdomen) with no distractions around you. DOING THE COUNT  Try to do the count the same time of day each time you do it.   Mark the day and time, then see how long it takes for you to feel 10 movements (kicks, flutters, swishes, rolls). You should have at least 10 movements within 2 hours. You will most likely feel 10 movements in much less than 2 hours. If you do not, wait an hour and count again. After a couple of days you will see a pattern.   What you are looking for is a change in the pattern or not enough Taylor in 2 hours. Is it taking longer in time to reach 10 movements?  SEEK MEDICAL CARE IF:  You feel less than 10 Taylor in 2 hours. Tried twice.   No movement in one hour.   The pattern is changing or taking longer each day  to reach 10 Taylor in 2 hours.   You feel the baby is not moving as it usually does.  Date: ____________ Movements: ____________ Start time: ____________ Connie Taylor time: ____________  Date: ____________ Movements: ____________ Start time: ____________ Connie Taylor time: ____________ Date: ____________ Movements: ____________ Start time: ____________ Connie Taylor time: ____________ Date: ____________ Movements: ____________ Start time: ____________ Connie Taylor time: ____________ Date: ____________ Movements: ____________ Start time: ____________ Connie Taylor time: ____________ Date: ____________ Movements: ____________ Start time: ____________ Connie Taylor time: ____________ Date: ____________ Movements: ____________ Start time: ____________ Connie Taylor time: ____________ Date: ____________ Movements: ____________ Start time: ____________ Connie Taylor time: ____________  Date: ____________ Movements: ____________ Start time: ____________ Connie Taylor time: ____________ Date: ____________ Movements: ____________ Start time: ____________ Connie Taylor time: ____________ Date: ____________ Movements: ____________ Start time: ____________ Connie Taylor time: ____________ Date: ____________ Movements: ____________ Start time: ____________ Connie Taylor time: ____________ Date: ____________ Movements: ____________ Start time: ____________ Connie Taylor time: ____________ Date: ____________ Movements: ____________ Start time: ____________ Connie Taylor time: ____________ Date:  ____________ Movements: ____________ Start time: ____________ Connie Taylor time: ____________  Date: ____________ Movements: ____________ Start time: ____________ Connie Taylor time: ____________ Date: ____________ Movements: ____________ Start time: ____________ Connie Taylor time: ____________ Date: ____________ Movements: ____________ Start time: ____________ Connie Taylor time: ____________ Date: ____________ Movements: ____________ Start time: ____________ Connie Taylor time: ____________ Date: ____________ Movements: ____________ Start  time: ____________ Connie Taylor time: ____________ Date: ____________ Movements: ____________ Start time: ____________ Connie Taylor time: ____________ Date: ____________ Movements: ____________ Start time: ____________ Connie Taylor time: ____________  Date: ____________ Movements: ____________ Start time: ____________ Connie Taylor time: ____________ Date: ____________ Movements: ____________ Start time: ____________ Connie Taylor time: ____________ Date: ____________ Movements: ____________ Start time: ____________ Connie Taylor time: ____________ Date: ____________ Movements: ____________ Start time: ____________ Connie Taylor time: ____________ Date: ____________ Movements: ____________ Start time: ____________ Connie Taylor time: ____________ Date: ____________ Movements: ____________ Start time: ____________ Connie Taylor time: ____________ Date: ____________ Movements: ____________ Start time: ____________ Connie Taylor time: ____________  Date: ____________ Movements: ____________ Start time: ____________ Connie Taylor time: ____________ Date: ____________ Movements: ____________ Start time: ____________ Connie Taylor time: ____________ Date: ____________ Movements: ____________ Start time: ____________ Connie Taylor time: ____________ Date: ____________ Movements: ____________ Start time: ____________ Connie Taylor time: ____________ Date: ____________ Movements: ____________ Start time: ____________ Connie Taylor time: ____________ Date: ____________ Movements: ____________ Start time: ____________ Connie Taylor time: ____________ Date: ____________ Movements: ____________ Start time: ____________ Connie Taylor time: ____________  Date: ____________ Movements: ____________ Start time: ____________ Connie Taylor time: ____________ Date: ____________ Movements: ____________ Start time: ____________ Connie Taylor time: ____________ Date: ____________ Movements: ____________ Start time: ____________ Connie Taylor time: ____________ Date: ____________ Movements: ____________ Start time: ____________ Connie Taylor time:  ____________ Date: ____________ Movements: ____________ Start time: ____________ Connie Taylor time: ____________ Date: ____________ Movements: ____________ Start time: ____________ Connie Taylor time: ____________ Date: ____________ Movements: ____________ Start time: ____________ Connie Taylor time: ____________  Date: ____________ Movements: ____________ Start time: ____________ Connie Taylor time: ____________ Date: ____________ Movements: ____________ Start time: ____________ Connie Taylor time: ____________ Date: ____________ Movements: ____________ Start time: ____________ Connie Taylor time: ____________ Date: ____________ Movements: ____________ Start time: ____________ Connie Taylor time: ____________ Date: ____________ Movements: ____________ Start time: ____________ Connie Taylor time: ____________ Date: ____________ Movements: ____________ Start time: ____________ Connie Taylor time: ____________ Date: ____________ Movements: ____________ Start time: ____________ Connie Taylor time: ____________  Date: ____________ Movements: ____________ Start time: ____________ Connie Taylor time: ____________ Date: ____________ Movements: ____________ Start time: ____________ Connie Taylor time: ____________ Date: ____________ Movements: ____________ Start time: ____________ Connie Taylor time: ____________ Date: ____________ Movements: ____________ Start time: ____________ Connie Taylor time: ____________ Date: ____________ Movements: ____________ Start time: ____________ Connie Taylor time: ____________ Date: ____________ Movements: ____________ Start time: ____________ Connie Taylor time: ____________ Document Released: 03/17/2006 Document Revised: 02/04/2011 Document Reviewed: 09/17/2008 [employer] Patient Information 2012 [employer].

## 2011-08-30 NOTE — Progress Notes (Signed)
[redacted]w[redacted]d Repeat BP 160/100 C/o headache, improving now after taking tyl w codeine this am PIH labs were normal last week on 6-25 24hour protein =7mg  on 6-28 Pt denies any N/V/RUQ pain,  Mild bilat LEE, reflexes 2+ S>D, most recent growth [place] at 33wks Will do BPP and growth [place] ASAP and recheck BP than Will discuss w MD further recommendations Ph# [phone] D/W Dr Estanislado Pandy, will increase labetalol to 400mg  TID, may need to switch to procardia  Pt has sched [place] for growth and BPP and ROB tmrw

## 2011-08-31 ENCOUNTER — Ambulatory Visit (INDEPENDENT_AMBULATORY_CARE_PROVIDER_SITE_OTHER): Payer: 59

## 2011-08-31 ENCOUNTER — Other Ambulatory Visit: Payer: Self-pay | Admitting: Obstetrics and Gynecology

## 2011-08-31 ENCOUNTER — Ambulatory Visit (INDEPENDENT_AMBULATORY_CARE_PROVIDER_SITE_OTHER): Payer: 59 | Admitting: Obstetrics and Gynecology

## 2011-08-31 VITALS — BP 134/88 | Wt 286.0 lb

## 2011-08-31 DIAGNOSIS — O09299 Supervision of pregnancy with other poor reproductive or obstetric history, unspecified trimester: Secondary | ICD-10-CM

## 2011-08-31 DIAGNOSIS — O139 Gestational [pregnancy-induced] hypertension without significant proteinuria, unspecified trimester: Secondary | ICD-10-CM

## 2011-08-31 DIAGNOSIS — O09899 Supervision of other high risk pregnancies, unspecified trimester: Secondary | ICD-10-CM

## 2011-08-31 DIAGNOSIS — IMO0002 Reserved for concepts with insufficient information to code with codable children: Secondary | ICD-10-CM

## 2011-08-31 LAB — US OB FOLLOW UP

## 2011-08-31 MED ORDER — LABETALOL HCL 100 MG PO TABS: 200.0000 mg | ORAL_TABLET | Freq: Three times a day (TID) | ORAL | Status: DC

## 2011-08-31 NOTE — Progress Notes (Signed)
ROB for growth USS today. GA [redacted] wks Hx PIH in pregnancy. Had been seen by [facility] for review and plan of care discussed with Dr R. Increased Labetalol to 400mg s po TID (commenced regimen 08/30/11 pm) had taken labetalol 400 mgs. Patient has stated that she is very sleepy on this dosage and can not care for her children. Tearful and crying as afraid that she will become very ill. Comforted and explained management plan. At present unemployed and has to pay for medication and can not afford to pay for meds. Will change tablet to 200mg s tablet and refill same. To see if [employer] will cover her meds. USS: Vx Pres Anterior placenta AFI 50th %  Nornal Growth 74th% BPP 8/8. Plan: to remain on present dosage and to return 08/07/11 for BPP and B/P check ROB

## 2011-08-31 NOTE — Progress Notes (Signed)
Pt states her Blood Pressure has been running high x 2 wks.  Pt states no other  concerns today. Pt desires cervix check today.

## 2011-09-01 ENCOUNTER — Telehealth: Payer: Self-pay

## 2011-09-01 NOTE — Telephone Encounter (Signed)
Pt is aware to take Labetalol 400 mg TID per [facility].

## 2011-09-06 ENCOUNTER — Encounter: Payer: 59 | Admitting: Obstetrics and Gynecology

## 2011-09-07 ENCOUNTER — Inpatient Hospital Stay (HOSPITAL_COMMUNITY)
Admission: AD | Admit: 2011-09-07 | Discharge: 2011-09-09 | DRG: 767 | Disposition: A | Discharge status: Home / Self Care | Payer: 59 | Source: Ambulatory Visit | Attending: Obstetrics and Gynecology | Admitting: Obstetrics and Gynecology

## 2011-09-07 ENCOUNTER — Encounter (HOSPITAL_COMMUNITY): Payer: Self-pay | Admitting: Anesthesiology

## 2011-09-07 ENCOUNTER — Encounter: Payer: Self-pay | Admitting: Obstetrics and Gynecology

## 2011-09-07 ENCOUNTER — Inpatient Hospital Stay (HOSPITAL_COMMUNITY): Payer: 59 | Admitting: Anesthesiology

## 2011-09-07 ENCOUNTER — Encounter (HOSPITAL_COMMUNITY): Payer: Self-pay | Admitting: *Deleted

## 2011-09-07 ENCOUNTER — Ambulatory Visit (INDEPENDENT_AMBULATORY_CARE_PROVIDER_SITE_OTHER): Payer: 59

## 2011-09-07 ENCOUNTER — Other Ambulatory Visit: Payer: 59

## 2011-09-07 ENCOUNTER — Ambulatory Visit (INDEPENDENT_AMBULATORY_CARE_PROVIDER_SITE_OTHER): Payer: 59 | Admitting: Obstetrics and Gynecology

## 2011-09-07 VITALS — BP 138/100 | Wt 284.0 lb

## 2011-09-07 DIAGNOSIS — Z22330 Carrier of Group B streptococcus: Secondary | ICD-10-CM

## 2011-09-07 DIAGNOSIS — O09899 Supervision of other high risk pregnancies, unspecified trimester: Secondary | ICD-10-CM

## 2011-09-07 DIAGNOSIS — O09299 Supervision of pregnancy with other poor reproductive or obstetric history, unspecified trimester: Secondary | ICD-10-CM

## 2011-09-07 DIAGNOSIS — O169 Unspecified maternal hypertension, unspecified trimester: Secondary | ICD-10-CM

## 2011-09-07 DIAGNOSIS — O9903 Anemia complicating the puerperium: Secondary | ICD-10-CM | POA: Diagnosis not present

## 2011-09-07 DIAGNOSIS — Z01818 Encounter for other preprocedural examination: Secondary | ICD-10-CM

## 2011-09-07 DIAGNOSIS — IMO0002 Reserved for concepts with insufficient information to code with codable children: Secondary | ICD-10-CM

## 2011-09-07 DIAGNOSIS — O1002 Pre-existing essential hypertension complicating childbirth: Principal | ICD-10-CM | POA: Diagnosis present

## 2011-09-07 DIAGNOSIS — D649 Anemia, unspecified: Secondary | ICD-10-CM | POA: Diagnosis not present

## 2011-09-07 DIAGNOSIS — O99892 Other specified diseases and conditions complicating childbirth: Secondary | ICD-10-CM | POA: Diagnosis present

## 2011-09-07 DIAGNOSIS — O139 Gestational [pregnancy-induced] hypertension without significant proteinuria, unspecified trimester: Secondary | ICD-10-CM

## 2011-09-07 DIAGNOSIS — Z302 Encounter for sterilization: Secondary | ICD-10-CM

## 2011-09-07 DIAGNOSIS — O99820 Streptococcus B carrier state complicating pregnancy: Secondary | ICD-10-CM

## 2011-09-07 LAB — COMPREHENSIVE METABOLIC PANEL
ALT: 10 U/L (ref 0–35)
AST: 14 U/L (ref 0–37)
CO2: 23 mEq/L (ref 19–32)
Calcium: 10.1 mg/dL (ref 8.4–10.5)
Chloride: 105 mEq/L (ref 96–112)
Creatinine, Ser: 0.56 mg/dL (ref 0.50–1.10)
GFR calc Af Amer: >90 mL/min (ref >90)
GFR calc non Af Amer: >90 mL/min (ref >90)
Glucose, Bld: 72 mg/dL (ref 70–99)
Total Bilirubin: 0.3 mg/dL (ref 0.3–1.2)

## 2011-09-07 LAB — CBC
HCT: 36.2 % (ref 36.0–46.0)
Hemoglobin: 11.4 g/dL — ABNORMAL LOW (ref 12.0–15.0)
MCH: 23.6 pg — ABNORMAL LOW (ref 26.0–34.0)
MCHC: 31.5 g/dL (ref 30.0–36.0)
RBC: 4.84 MIL/uL (ref 3.87–5.11)

## 2011-09-07 LAB — URIC ACID: Uric Acid, Serum: 6.4 mg/dL (ref 2.4–7.0)

## 2011-09-07 MED ORDER — PENICILLIN G POTASSIUM 5000000 UNITS IJ SOLR: 2.5000 10*6.[IU] | INTRAVENOUS | Status: DC

## 2011-09-07 MED ORDER — BUTORPHANOL TARTRATE 2 MG/ML IJ SOLN: 1.0000 mg | INTRAMUSCULAR | Status: DC | PRN

## 2011-09-07 MED ORDER — ONDANSETRON HCL 4 MG/2ML IJ SOLN: 4.0000 mg | Freq: Four times a day (QID) | INTRAMUSCULAR | Status: DC | PRN

## 2011-09-07 MED ORDER — IBUPROFEN 600 MG PO TABS: 600.0000 mg | ORAL_TABLET | Freq: Four times a day (QID) | ORAL | Status: DC | PRN

## 2011-09-07 MED ORDER — PENICILLIN G POTASSIUM 5000000 UNITS IJ SOLR
5.0000 10*6.[IU] | Freq: Once | INTRAMUSCULAR | Status: DC
  Administered 2011-09-07: 5 10*6.[IU] via INTRAVENOUS
  Filled 2011-09-07: qty 5

## 2011-09-07 MED ORDER — FLEET ENEMA 7-19 GM/118ML RE ENEM: 1.0000 | ENEMA | RECTAL | Status: DC | PRN

## 2011-09-07 MED ORDER — OXYTOCIN 40 UNITS IN LACTATED RINGERS INFUSION - SIMPLE MED: 1.0000 m[IU]/min | INTRAVENOUS | Status: DC

## 2011-09-07 MED ORDER — EPHEDRINE 5 MG/ML INJ: 10.0000 mg | INTRAVENOUS | Status: DC | PRN

## 2011-09-07 MED ORDER — PENICILLIN G POTASSIUM 5000000 UNITS IJ SOLR
5.0000 10*6.[IU] | Freq: Once | INTRAVENOUS | Status: DC
  Filled 2011-09-07: qty 5

## 2011-09-07 MED ORDER — OXYTOCIN BOLUS FROM INFUSION
250.0000 mL | Freq: Once | INTRAVENOUS | Status: AC
  Administered 2011-09-08: 250 mL via INTRAVENOUS
  Filled 2011-09-07: qty 500

## 2011-09-07 MED ORDER — LABETALOL HCL 5 MG/ML IV SOLN
10.0000 mg | INTRAVENOUS | Status: DC | PRN
  Filled 2011-09-07: qty 4

## 2011-09-07 MED ORDER — OXYCODONE-ACETAMINOPHEN 5-325 MG PO TABS: 1.0000 | ORAL_TABLET | ORAL | Status: DC | PRN

## 2011-09-07 MED ORDER — LACTATED RINGERS IV SOLN
500.0000 mL | Freq: Once | INTRAVENOUS | Status: AC
  Administered 2011-09-07: 500 mL via INTRAVENOUS

## 2011-09-07 MED ORDER — PHENYLEPHRINE 40 MCG/ML (10ML) SYRINGE FOR IV PUSH (FOR BLOOD PRESSURE SUPPORT): 80.0000 ug | PREFILLED_SYRINGE | INTRAVENOUS | Status: DC | PRN

## 2011-09-07 MED ORDER — CITRIC ACID-SODIUM CITRATE 334-500 MG/5ML PO SOLN
30.0000 mL | ORAL | Status: DC | PRN
  Administered 2011-09-08: 30 mL via ORAL
  Filled 2011-09-07: qty 15

## 2011-09-07 MED ORDER — TERBUTALINE SULFATE 1 MG/ML IJ SOLN: 0.2500 mg | Freq: Once | INTRAMUSCULAR | Status: AC | PRN

## 2011-09-07 MED ORDER — PENICILLIN G POTASSIUM 5000000 UNITS IJ SOLR
2.5000 10*6.[IU] | INTRAVENOUS | Status: DC
  Filled 2011-09-07 (×3): qty 2.5

## 2011-09-07 MED ORDER — LACTATED RINGERS IV SOLN
INTRAVENOUS | Status: DC
  Administered 2011-09-07: 16:00:00 via INTRAVENOUS

## 2011-09-07 MED ORDER — FENTANYL 2.5 MCG/ML BUPIVACAINE 1/10 % EPIDURAL INFUSION (WH - ANES)
14.0000 mL/h | INTRAMUSCULAR | Status: DC
  Administered 2011-09-07: 14 mL/h via EPIDURAL
  Filled 2011-09-07: qty 60

## 2011-09-07 MED ORDER — PENICILLIN G POTASSIUM 5000000 UNITS IJ SOLR
2.5000 10*6.[IU] | INTRAVENOUS | Status: DC
  Administered 2011-09-08: 2.5 10*6.[IU] via INTRAVENOUS
  Filled 2011-09-07 (×5): qty 2.5

## 2011-09-07 MED ORDER — LACTATED RINGERS IV SOLN
500.0000 mL | INTRAVENOUS | Status: DC | PRN
  Administered 2011-09-07: 300 mL via INTRAVENOUS

## 2011-09-07 MED ORDER — DIPHENHYDRAMINE HCL 50 MG/ML IJ SOLN: 12.5000 mg | INTRAMUSCULAR | Status: DC | PRN

## 2011-09-07 MED ORDER — EPHEDRINE 5 MG/ML INJ
10.0000 mg | INTRAVENOUS | Status: DC | PRN
  Filled 2011-09-07: qty 4

## 2011-09-07 MED ORDER — ACETAMINOPHEN 325 MG PO TABS: 650.0000 mg | ORAL_TABLET | ORAL | Status: DC | PRN

## 2011-09-07 MED ORDER — MISOPROSTOL 25 MCG QUARTER TABLET
25.0000 ug | ORAL_TABLET | ORAL | Status: DC | PRN
  Administered 2011-09-07: 25 ug via VAGINAL
  Filled 2011-09-07 (×2): qty 0.25

## 2011-09-07 MED ORDER — LABETALOL HCL 200 MG PO TABS
400.0000 mg | ORAL_TABLET | Freq: Three times a day (TID) | ORAL | Status: DC
  Administered 2011-09-07: 400 mg via ORAL
  Filled 2011-09-07 (×2): qty 2

## 2011-09-07 MED ORDER — LIDOCAINE HCL (PF) 1 % IJ SOLN
INTRAMUSCULAR | Status: DC | PRN
  Administered 2011-09-07 (×2): 5 mL

## 2011-09-07 MED ORDER — LABETALOL HCL 200 MG PO TABS
400.0000 mg | ORAL_TABLET | Freq: Three times a day (TID) | ORAL | Status: DC
  Filled 2011-09-07: qty 2

## 2011-09-07 MED ORDER — LIDOCAINE HCL (PF) 1 % IJ SOLN
30.0000 mL | INTRAMUSCULAR | Status: DC | PRN
  Filled 2011-09-07: qty 30

## 2011-09-07 MED ORDER — OXYTOCIN 40 UNITS IN LACTATED RINGERS INFUSION - SIMPLE MED
62.5000 mL/h | Freq: Once | INTRAVENOUS | Status: DC
  Filled 2011-09-07: qty 1000

## 2011-09-07 MED ORDER — TERBUTALINE SULFATE 1 MG/ML IJ SOLN
0.2500 mg | Freq: Once | INTRAMUSCULAR | Status: AC | PRN
  Filled 2011-09-07: qty 1

## 2011-09-07 MED ORDER — PHENYLEPHRINE 40 MCG/ML (10ML) SYRINGE FOR IV PUSH (FOR BLOOD PRESSURE SUPPORT)
80.0000 ug | PREFILLED_SYRINGE | INTRAVENOUS | Status: DC | PRN
  Filled 2011-09-07: qty 5

## 2011-09-07 MED ORDER — PENICILLIN G POTASSIUM 5000000 UNITS IJ SOLR: 5.0000 10*6.[IU] | Freq: Once | INTRAVENOUS | Status: DC

## 2011-09-07 NOTE — Progress Notes (Signed)
Comfortable with epidural, agrees to IUPC and IV pitocin if indicated O VSS      fhts category 1      abd soft between uc      Contractions 1-3 mild to mod      Vag unchanged A 38 week induction CHTN P continue care Lavera Guise, CNM

## 2011-09-07 NOTE — Progress Notes (Signed)
BPP 8/8 ( 09/07/11)

## 2011-09-07 NOTE — Progress Notes (Signed)
Comfortable, some pressure O VSS      fhts 140 LTV mod non repetitive varaible decels      abd soft between uc      Contractions 2 -3 adequate      Vag 4-5 100 -1 VTX R clear      BP 125/68  Pulse 76  Temp 98.1 F (36.7 C) (Oral)  Resp 20  Ht 5\' 5"  (1.651 m)  Wt 284 lb (128.822 kg)  BMI 47.26 kg/m2  SpO2 100%  LMP 11/16/2010 A adequate uc since 2200 P did not start pitocin, adequate uc,repositioned to L side, continue care Connie Taylor, CNM

## 2011-09-07 NOTE — Progress Notes (Signed)
[redacted]w[redacted]d B/P remains elevated although remains on Max dose of Labetalol 1200mg s daily (400mg s TID). Patient has been totally compliant with management plan. Consultation with Dr Pennie Rushing and plan to IOL today. VL at hospital and plan for admission. VL contacted and VL to assess for methodoolgy of IOL based on SVE at time of admission. IOL for exacerbation of PIH

## 2011-09-07 NOTE — Progress Notes (Signed)
  Subjective: Comfortable, no contractions.  Objective: BP 146/90  Pulse 93  Temp 97.9 F (36.6 C) (Oral)  Resp 20  Ht 5\' 5"  (1.651 m)  Wt 284 lb (128.822 kg)  BMI 47.26 kg/m2  LMP 11/16/2010   Filed Vitals:   09/07/11 1455 09/07/11 1520  BP: 146/90   Pulse: 93   Temp: 97.9 F (36.6 C)   TempSrc: Oral   Resp: 20   Height:  5\' 5"  (1.651 m)  Weight:  284 lb (128.822 kg)   Results for orders placed during the hospital encounter of 09/07/11 (from the past 24 hour(s))  CBC     Status: Abnormal   Collection Time   09/07/11  3:40 PM      Component Value Range   WBC 8.7  4.0 - 10.5 K/uL   RBC 4.84  3.87 - 5.11 MIL/uL   Hemoglobin 11.4 (*) 12.0 - 15.0 g/dL   HCT 16.1  09.6 - 04.5 %   MCV 74.8 (*) 78.0 - 100.0 fL   MCH 23.6 (*) 26.0 - 34.0 pg   MCHC 31.5  30.0 - 36.0 g/dL   RDW 40.9  81.1 - 91.4 %   Platelets 343  150 - 400 K/uL   CMP, LDH, uric acid pending.    FHT:  Category 1 UC:   None SVE:   Dilation: 1 Effacement (%): 50 Station: -2 Exam by:: Sarra Rachels, cnm  Cytotech 25 mcg placed in posterior fornix.   Assessment / Plan: Induction of labor due to chronic hypertension with exacerbation during pregnancy R&B of induction reviewed with patient, including need for serial induction, failure of method, need for C/S--patient and partner seem to understand these risks and wish to proceed. Will plan Cytotech q 4 hours up to 4 doses, then pitocin prn.   Nigel Bridgeman 09/07/2011, 4:26 PM

## 2011-09-07 NOTE — Progress Notes (Signed)
Comfortable, some pressure O VSS      fhts category 1      abd soft between uc      Contractions q 2-4 mild      Vag 3 80 -1/-2 VTX arm mod amount clear fluid A 38 week induction HTN P arm and scalp electrode place easily with pt consent, pitocin if indicated, epidural when desires, continue care Lavera Guise, CNM

## 2011-09-07 NOTE — Progress Notes (Signed)
C/O: BP meds making her feel very dizzy and loopy.    Request cx check.

## 2011-09-07 NOTE — Progress Notes (Signed)
Reviewed progress notes bps with Dr. Pennie Rushing discussed perameters to hold labetalol if S below 120 D below 70. Continue care. Lavera Guise, CNM

## 2011-09-07 NOTE — Anesthesia Preprocedure Evaluation (Signed)
Anesthesia Evaluation  Patient identified by MRN, date of birth, ID band Patient awake    Reviewed: Allergy & Precautions, H&P , Patient's Chart, lab work & pertinent test results  Airway Mallampati: III TM Distance: >3 FB Neck ROM: full    Dental No notable dental hx.    Pulmonary neg pulmonary ROS,  breath sounds clear to auscultation  Pulmonary exam normal       Cardiovascular hypertension, negative cardio ROS  Rhythm:regular Rate:Normal     Neuro/Psych negative neurological ROS  negative psych ROS   GI/Hepatic negative GI ROS, Neg liver ROS,   Endo/Other  negative endocrine ROSMorbid obesity  Renal/GU negative Renal ROS     Musculoskeletal   Abdominal   Peds  Hematology negative hematology ROS (+)   Anesthesia Other Findings   Reproductive/Obstetrics (+) Pregnancy                           Anesthesia Physical Anesthesia Plan  ASA: III  Anesthesia Plan: Epidural   Post-op Pain Management:    Induction:   Airway Management Planned:   Additional Equipment:   Intra-op Plan:   Post-operative Plan:   Informed Consent: I have reviewed the patients History and Physical, chart, labs and discussed the procedure including the risks, benefits and alternatives for the proposed anesthesia with the patient or authorized representative who has indicated his/her understanding and acceptance.     Plan Discussed with:   Anesthesia Plan Comments:         Anesthesia Quick Evaluation  

## 2011-09-07 NOTE — H&P (Signed)
Connie Taylor is a 26 y.o. female presenting for admission for induction due to chronic hypertension with exacerbation during pregnancy--her Labetalol was increased last week to 400 mg po TID, with BP today 130/100.  Dr. Pennie Rushing was consulted, and the decision was made to proceed with induction.  Patient denies HA, visual symptoms, epigastric pain, or any other issues.  She has occasional contractions and reports +FM.   History of present pregnancy: Patient entered care at 11 weeks by LMP, but 7 weeks by [place], with EDC of 09/21/10 established by that scan.  24 hour urine and PIH labs were WNL in the 1st trimester as a baseline.  Anatomy scan was done at 23 weeks, with normal findings and an anterior placenta.  Further ultrasounds were done during pregnancy for weekly BPPs and growth at regular intervals, with normal findings.  She was followed closely due to her history of chronic hypertension, with eventual increase in Labetalol approx 1 week ago to 400 mg po TID. GBS was positive. She was seen in the office today with BP 130/100.  History OB History    Grav Para Term Preterm Abortions TAB SAB Ect Mult Living   3 2 2  0 0 0 0 0 0 2    #1--2008.  SVB, Female, 6+15, 38 weeks, 3 hour labor. Epidural, induced due to PIH at [facility].  No meds #2--2011.  SVB.  Female, y7+2, 39 weeks 5 hour labor.  Epidural, induced due to PIH at [facility].  No meds #3--Current   Past Medical History  Diagnosis Date  . Chlamydia 4 years ago  . Anemia   . Hypertension     HTN during pregnancy  . Urinary tract infection    Past Surgical History  Procedure Date  . No past surgeries    Family History: family history includes Cancer in her maternal grandfather; Diabetes in her maternal grandmother; Hypertension in her maternal grandfather and maternal grandmother; Hyperthyroidism in her maternal grandmother; and Seizures in her father.  There is no history of Anesthesia problems.  Social History:  reports that she has never  smoked. She has never used smokeless tobacco. She reports that she does not drink alcohol or use illicit drugs.  FOB is involved and supportive.  Patient is employed in billing, she has a high-school education plus some college.  She is [place], of the [place] faith.   Prenatal Transfer Tool  Maternal Diabetes: No Genetic Screening: Normal Maternal Ultrasounds/Referrals: Normal Fetal Ultrasounds or other Referrals:  None Maternal Substance Abuse:  No Significant Maternal Medications:  Meds include: Other: see prenatal record (Labetalol 400 mg po TID) Significant Maternal Lab Results:  None Other Comments:  None  ROS:  Occasional contractions, +FM.  Dilation: 1 Effacement (%): 50 Station: -2 Exam by:: Connie Taylor, Connie Taylor Blood pressure 146/90, pulse 93, temperature 97.9 F (36.6 C), temperature source Oral, resp. rate 20, height 5\' 5"  (1.651 m), weight 284 lb (128.822 kg), last menstrual period 11/16/2010.  Exam Physical Exam  Chest clear Heart RRR without murmur Abd gravid, NT, no RUQ tenderness or epigastric pain Pelvic--posterior, 1 cm, 50%, vtx, -2. Ext DTR 1+, trace edema  FHR reactive, no decels UCs none  Prenatal labs: ABO, Rh:  O+ Antibody:  Neg Rubella:  Non-immune RPR: NON REAC (04/29 0448)  HBsAg:   Neg HIV:   Neg GBS:  Pos Sickle cell negative Glucola WNL Hgb at NOB 11.1 and at 28 weeks Pap WNL December 2012 Cultures negative at [facility] visit 24  hour urine 63 at 12 weeks, 7 at 36 weeks PIH labs WNL at 12 weeks and on 08/26/11 1st trimester screen and AFP WNL   Assessment/Plan: IUP at 38 weeks Chronic hypertension with exacerbation during pregnancy GBS positive Unfavorable cervix  Plan: Admit to Birthing Suite per consult with Dr. Pennie Rushing Routine [facility] orders Cervical ripening with Cytotech Continue Labetalol 400 mg po TID Start GBS Rx with PCN with onset of active labor or initiation of pitocin. Patient will plan epidural as labor  advances.  Nigel Bridgeman 09/07/2011, 3:34 PM

## 2011-09-07 NOTE — Anesthesia Procedure Notes (Signed)
Epidural Patient location during procedure: OB Start time: 09/07/2011 9:34 PM  Staffing Anesthesiologist: Brayton Caves R Performed by: anesthesiologist   Preanesthetic Checklist Completed: patient identified, site marked, surgical consent, pre-op evaluation, timeout performed, IV checked, risks and benefits discussed and monitors and equipment checked  Epidural Patient position: sitting Prep: site prepped and draped and DuraPrep Patient monitoring: continuous pulse ox and blood pressure Approach: midline Injection technique: LOR air and LOR saline  Needle:  Needle type: Tuohy  Needle gauge: 17 G Needle length: 9 cm Needle insertion depth: 8 cm Catheter type: closed end flexible Catheter size: 19 Gauge Catheter at skin depth: 13 cm Test dose: negative  Assessment Events: blood not aspirated, injection not painful, no injection resistance, negative IV test and no paresthesia  Additional Notes Patient identified.  Risk benefits discussed including failed block, incomplete pain control, headache, nerve damage, paralysis, blood pressure changes, nausea, vomiting, reactions to medication both toxic or allergic, and postpartum back pain.  Patient expressed understanding and wished to proceed.  All questions were answered.  Sterile technique used throughout procedure and epidural site dressed with sterile barrier dressing. No paresthesia or other complications noted.The patient did not experience any signs of intravascular injection such as tinnitus or metallic taste in mouth nor signs of intrathecal spread such as rapid motor block. Please see nursing notes for vital signs.

## 2011-09-08 ENCOUNTER — Inpatient Hospital Stay (HOSPITAL_COMMUNITY): Payer: 59 | Admitting: Anesthesiology

## 2011-09-08 ENCOUNTER — Encounter (HOSPITAL_COMMUNITY): Payer: Self-pay | Admitting: Anesthesiology

## 2011-09-08 ENCOUNTER — Encounter (HOSPITAL_COMMUNITY)
Admission: AD | Disposition: A | Discharge status: Home / Self Care | Payer: Self-pay | Source: Ambulatory Visit | Attending: Obstetrics and Gynecology

## 2011-09-08 ENCOUNTER — Encounter (HOSPITAL_COMMUNITY): Payer: Self-pay | Admitting: *Deleted

## 2011-09-08 DIAGNOSIS — Z302 Encounter for sterilization: Secondary | ICD-10-CM

## 2011-09-08 DIAGNOSIS — O328XX0 Maternal care for other malpresentation of fetus, not applicable or unspecified: Secondary | ICD-10-CM

## 2011-09-08 DIAGNOSIS — O1002 Pre-existing essential hypertension complicating childbirth: Secondary | ICD-10-CM

## 2011-09-08 HISTORY — PX: TUBAL LIGATION: SHX77

## 2011-09-08 LAB — MRSA PCR SCREENING: MRSA by PCR: NEGATIVE

## 2011-09-08 LAB — CBC
Hemoglobin: 9.9 g/dL — ABNORMAL LOW (ref 12.0–15.0)
MCH: 23.7 pg — ABNORMAL LOW (ref 26.0–34.0)
MCH: 23.9 pg — ABNORMAL LOW (ref 26.0–34.0)
MCHC: 31.5 g/dL (ref 30.0–36.0)
MCV: 75.1 fL — ABNORMAL LOW (ref 78.0–100.0)
MCV: 75.4 fL — ABNORMAL LOW (ref 78.0–100.0)
Platelets: 278 10*3/uL (ref 150–400)
RBC: 3.98 MIL/uL (ref 3.87–5.11)

## 2011-09-08 LAB — RPR: RPR Ser Ql: NONREACTIVE

## 2011-09-08 SURGERY — LIGATION, FALLOPIAN TUBE, POSTPARTUM
Anesthesia: Epidural | Site: Abdomen | Laterality: Bilateral | Wound class: Clean Contaminated

## 2011-09-08 MED ORDER — SENNOSIDES-DOCUSATE SODIUM 8.6-50 MG PO TABS
2.0000 | ORAL_TABLET | Freq: Every day | ORAL | Status: DC
  Administered 2011-09-08: 2 via ORAL

## 2011-09-08 MED ORDER — MIDAZOLAM HCL 2 MG/2ML IJ SOLN
INTRAMUSCULAR | Status: AC
  Filled 2011-09-08: qty 2

## 2011-09-08 MED ORDER — SODIUM BICARBONATE 8.4 % IV SOLN
INTRAVENOUS | Status: AC
  Filled 2011-09-08: qty 50

## 2011-09-08 MED ORDER — IBUPROFEN 600 MG PO TABS
600.0000 mg | ORAL_TABLET | Freq: Four times a day (QID) | ORAL | Status: DC
  Administered 2011-09-08 – 2011-09-09 (×5): 600 mg via ORAL
  Filled 2011-09-08 (×6): qty 1

## 2011-09-08 MED ORDER — PRENATAL MULTIVITAMIN CH
1.0000 | ORAL_TABLET | Freq: Every day | ORAL | Status: DC
  Administered 2011-09-08 – 2011-09-09 (×2): 1 via ORAL
  Filled 2011-09-08 (×2): qty 1

## 2011-09-08 MED ORDER — METOCLOPRAMIDE HCL 10 MG PO TABS
10.0000 mg | ORAL_TABLET | Freq: Once | ORAL | Status: AC
  Administered 2011-09-08: 10 mg via ORAL
  Filled 2011-09-08: qty 1

## 2011-09-08 MED ORDER — MIDAZOLAM HCL 5 MG/5ML IJ SOLN
INTRAMUSCULAR | Status: DC | PRN
  Administered 2011-09-08: 2 mg via INTRAVENOUS

## 2011-09-08 MED ORDER — WITCH HAZEL-GLYCERIN EX PADS: 1.0000 "application " | MEDICATED_PAD | CUTANEOUS | Status: DC | PRN

## 2011-09-08 MED ORDER — ONDANSETRON HCL 4 MG/2ML IJ SOLN: 4.0000 mg | INTRAMUSCULAR | Status: DC | PRN

## 2011-09-08 MED ORDER — FENTANYL CITRATE 0.05 MG/ML IJ SOLN
INTRAMUSCULAR | Status: AC
  Filled 2011-09-08: qty 2

## 2011-09-08 MED ORDER — SIMETHICONE 80 MG PO CHEW: 80.0000 mg | CHEWABLE_TABLET | ORAL | Status: DC | PRN

## 2011-09-08 MED ORDER — BUPIVACAINE HCL (PF) 0.25 % IJ SOLN
INTRAMUSCULAR | Status: AC
  Filled 2011-09-08: qty 30

## 2011-09-08 MED ORDER — TETANUS-DIPHTH-ACELL PERTUSSIS 5-2.5-18.5 LF-MCG/0.5 IM SUSP
0.5000 mL | Freq: Once | INTRAMUSCULAR | Status: AC
  Administered 2011-09-08: 0.5 mL via INTRAMUSCULAR
  Filled 2011-09-08: qty 0.5

## 2011-09-08 MED ORDER — LACTATED RINGERS IV SOLN
INTRAVENOUS | Status: DC | PRN
  Administered 2011-09-08 (×2): via INTRAVENOUS

## 2011-09-08 MED ORDER — KETOROLAC TROMETHAMINE 30 MG/ML IJ SOLN
INTRAMUSCULAR | Status: AC
  Filled 2011-09-08: qty 1

## 2011-09-08 MED ORDER — BENZOCAINE-MENTHOL 20-0.5 % EX AERO
1.0000 "application " | INHALATION_SPRAY | CUTANEOUS | Status: DC | PRN
  Administered 2011-09-08: 1 via TOPICAL
  Filled 2011-09-08: qty 56

## 2011-09-08 MED ORDER — LABETALOL HCL 200 MG PO TABS
200.0000 mg | ORAL_TABLET | Freq: Two times a day (BID) | ORAL | Status: DC
  Administered 2011-09-08 – 2011-09-09 (×3): 200 mg via ORAL
  Filled 2011-09-08 (×6): qty 1

## 2011-09-08 MED ORDER — ONDANSETRON HCL 4 MG PO TABS: 4.0000 mg | ORAL_TABLET | ORAL | Status: DC | PRN

## 2011-09-08 MED ORDER — LIDOCAINE-EPINEPHRINE (PF) 2 %-1:200000 IJ SOLN
INTRAMUSCULAR | Status: AC
  Filled 2011-09-08: qty 20

## 2011-09-08 MED ORDER — DIPHENHYDRAMINE HCL 25 MG PO CAPS: 25.0000 mg | ORAL_CAPSULE | Freq: Four times a day (QID) | ORAL | Status: DC | PRN

## 2011-09-08 MED ORDER — ZOLPIDEM TARTRATE 5 MG PO TABS: 5.0000 mg | ORAL_TABLET | Freq: Every evening | ORAL | Status: DC | PRN

## 2011-09-08 MED ORDER — LANOLIN HYDROUS EX OINT: TOPICAL_OINTMENT | CUTANEOUS | Status: DC | PRN

## 2011-09-08 MED ORDER — DIBUCAINE 1 % RE OINT: 1.0000 "application " | TOPICAL_OINTMENT | RECTAL | Status: DC | PRN

## 2011-09-08 MED ORDER — SODIUM BICARBONATE 8.4 % IV SOLN
INTRAVENOUS | Status: DC | PRN
  Administered 2011-09-08: 3 mL via EPIDURAL

## 2011-09-08 MED ORDER — KETOROLAC TROMETHAMINE 30 MG/ML IJ SOLN
INTRAMUSCULAR | Status: DC | PRN
  Administered 2011-09-08: 30 mg via INTRAVENOUS

## 2011-09-08 MED ORDER — FENTANYL CITRATE 0.05 MG/ML IJ SOLN: 25.0000 ug | INTRAMUSCULAR | Status: DC | PRN

## 2011-09-08 MED ORDER — FAMOTIDINE 20 MG PO TABS
40.0000 mg | ORAL_TABLET | Freq: Once | ORAL | Status: AC
  Administered 2011-09-08: 40 mg via ORAL
  Filled 2011-09-08: qty 2

## 2011-09-08 MED ORDER — FENTANYL CITRATE 0.05 MG/ML IJ SOLN
INTRAMUSCULAR | Status: DC | PRN
  Administered 2011-09-08: 50 ug via INTRAVENOUS

## 2011-09-08 MED ORDER — TERBUTALINE SULFATE 1 MG/ML IJ SOLN
0.2500 mg | Freq: Once | INTRAMUSCULAR | Status: AC
  Administered 2011-09-08: 0.25 mg via SUBCUTANEOUS

## 2011-09-08 MED ORDER — LACTATED RINGERS IV SOLN
INTRAVENOUS | Status: DC
  Administered 2011-09-08 (×2): via INTRAVENOUS

## 2011-09-08 MED ORDER — LACTATED RINGERS IV SOLN
INTRAVENOUS | Status: DC
  Administered 2011-09-08: 01:00:00 via INTRAUTERINE

## 2011-09-08 MED ORDER — OXYCODONE-ACETAMINOPHEN 5-325 MG PO TABS: 1.0000 | ORAL_TABLET | ORAL | Status: DC | PRN

## 2011-09-08 MED ORDER — BUPIVACAINE HCL (PF) 0.25 % IJ SOLN
INTRAMUSCULAR | Status: DC | PRN
  Administered 2011-09-08: 10 mL

## 2011-09-08 SURGICAL SUPPLY — 25 items
ADH SKN CLS APL DERMABOND .7 (GAUZE/BANDAGES/DRESSINGS) ×2
CHLORAPREP W/TINT 26ML (MISCELLANEOUS) ×2 IMPLANT
CLOTH BEACON ORANGE TIMEOUT ST (SAFETY) ×2 IMPLANT
CONTAINER PREFILL 10% NBF 15ML (MISCELLANEOUS) ×4 IMPLANT
DERMABOND ADVANCED (GAUZE/BANDAGES/DRESSINGS) ×2
DERMABOND ADVANCED .7 DNX12 (GAUZE/BANDAGES/DRESSINGS) IMPLANT
ELECT REM PT RETURN 9FT ADLT (ELECTROSURGICAL) ×2
ELECTRODE REM PT RTRN 9FT ADLT (ELECTROSURGICAL) ×1 IMPLANT
GLOVE BIO SURGEON STRL SZ7.5 (GLOVE) ×4 IMPLANT
GLOVE BIOGEL PI IND STRL 7.5 (GLOVE) ×1 IMPLANT
GLOVE BIOGEL PI INDICATOR 7.5 (GLOVE) ×1
GOWN PREVENTION PLUS LG XLONG (DISPOSABLE) ×2 IMPLANT
NDL HYPO 25X1 1.5 SAFETY (NEEDLE) IMPLANT
NEEDLE HYPO 25X1 1.5 SAFETY (NEEDLE) ×2 IMPLANT
NS IRRIG 1000ML POUR BTL (IV SOLUTION) ×2 IMPLANT
PACK ABDOMINAL MINOR (CUSTOM PROCEDURE TRAY) ×2 IMPLANT
PENCIL BUTTON HOLSTER BLD 10FT (ELECTRODE) ×2 IMPLANT
SPONGE LAP 4X18 X RAY DECT (DISPOSABLE) ×1 IMPLANT
SUT MNCRL AB 3-0 PS2 27 (SUTURE) ×2 IMPLANT
SUT PLAIN 0 NONE (SUTURE) ×2 IMPLANT
SUT VIC AB 0 CT1 36 (SUTURE) ×2 IMPLANT
SYR CONTROL 10ML LL (SYRINGE) ×1 IMPLANT
TOWEL OR 17X24 6PK STRL BLUE (TOWEL DISPOSABLE) ×4 IMPLANT
TRAY FOLEY CATH 14FR (SET/KITS/TRAYS/PACK) ×2 IMPLANT
WATER STERILE IRR 1000ML POUR (IV SOLUTION) ×2 IMPLANT

## 2011-09-08 NOTE — Transfer of Care (Signed)
Immediate Anesthesia Transfer of Care Note  Patient: Connie Taylor  Procedure(s) Performed: Procedure(s) (LRB): POST PARTUM TUBAL LIGATION (Bilateral)  Patient Location: PACU  Anesthesia Type: Epidural  Level of Consciousness: awake  Airway & Oxygen Therapy: Patient Spontanous Breathing  Post-op Assessment: Report given to PACU RN  Post vital signs: Reviewed and stable  Complications: No apparent anesthesia complications

## 2011-09-08 NOTE — Op Note (Signed)
Subjective: I was called to evaluate the patient who had begun active labor and advanced to 8 cm after a single dose of Cytotec. She was admitted for induction because of hypertension.  She had received an epidural.  She began having deep variable decelerations to a nadir of 30 beats per min, but with good variability.  Objective BP 135/64  Pulse 95  Temp 98.1 F (36.7 C) (Oral)  Resp 20  Ht 5\' 5"  (1.651 m)  Wt 284 lb (128.822 kg)  BMI 47.26 kg/m2  SpO2 100%  LMP 11/16/2010 By the time I reached the room the patient was completely dilated and was pushing. She rapidly pushed the baby to be +2 position. I obtained verbal consent for vacuum assisted vaginal delivery and over the next 3 contractions was able to assist in the delivery of the fetal head in the occiput posterior position.  A tight nuchal cord was clamped on the perineum and cut. The remainder of the infant was delivered with maternal expulsive efforts. The neonatologist and her team were in attendance. The infant was immediately handed over to the neonatal team.  Operative Delivery Note At 12:46 AM a viable female was delivered via .  Presentation: vertex; Position: Occiput,, Posterior; Station: +2.  Verbal consent: obtained from patient.  Risks and benefits discussed in detail.  Risks include, but are not limited to the risks of anesthesia, bleeding, infection, damage to maternal tissues, fetal cephalhematoma.  There is also the risk of inability to effect vaginal delivery of the head, or shoulder dystocia that cannot be resolved by established maneuvers, leading to the need for emergency cesarean section.  APGAR: 3, 9; weight Pending Placenta status:spontaneous and intact , .   Cord: 3 vessels with the following complications: .  Cord pH: 7.23  Anesthesia:epidural   Instruments:Kiwi Episiotomy: none Lacerations: none Suture Repair: n/a Est. Blood Loss (mL):   Mom to postpartum.  Baby to remained in the delivery room for  initial skin to Skin-Bonding.  HAYGOOD,VANESSA P 09/08/2011, 1:13 AM

## 2011-09-08 NOTE — Consult Note (Signed)
Neonatology Note:  Attendance at Delivery:  I was asked to attend this vacuum-assisted vaginal delivery at term. The mother is a G3P2 O pos, GBS pos with PIH, on labetalol. ROM 4 hours prior to delivery, fluid clear, mother afebrile during labor. She received Pen G >4 hours prior to delivery. At delivery, infant floppy, dusky, and without spontaneous respirations, but with good HR. We did bulb suctioning, then gave vigorous stimulation. Her HR remained above 100, and she opened her eyes, but continued to be apneic, so we applied PPV at just after 1 minute of age. We continued PPV for about 1 minute, with improving color and some tone. She then began to cry, with increasing vigor over the next 2-3 minutes. Her tone was normal by 5 minutes and color maintained pink in room air. Lungs clear to ausc in DR. Ap 3/9. To CN to care of Pediatrician.  Voyd Groft, MD  

## 2011-09-08 NOTE — Progress Notes (Signed)
Called by RN to evaluate pt feeling a lot of pressure Calm, c/o of a lot of pressure, lying on L side, vag exam then to R side fhts 120s with severe variable decels to 60s LTV marked uc q 2 75 mmHg abd soft between uc  bp 128/68, vag 8 100 0 at 2325, ordered terbutaline, Dr. Mikey Bussing updated on telephone as documented and amnioinfusion added to order with consent by pt, called for physician [facility] backup and Dr. Ladean Raya at bedside and started pushing  at 2335 C 0/+1 with urge to push, see Dr. Pennie Rushing note for delivery. Lavera Guise, CNM

## 2011-09-08 NOTE — Progress Notes (Signed)
Pt. To Short Stay via stretcher.  IV patent, epidural cath taped to back.  Pt. NPO

## 2011-09-08 NOTE — Anesthesia Preprocedure Evaluation (Signed)
Anesthesia Evaluation  Patient identified by MRN, date of birth, ID band Patient awake    Reviewed: Allergy & Precautions, H&P , Patient's Chart, lab work & pertinent test results  Airway Mallampati: III TM Distance: >3 FB Neck ROM: full    Dental No notable dental hx.    Pulmonary neg pulmonary ROS,  breath sounds clear to auscultation  Pulmonary exam normal       Cardiovascular hypertension, negative cardio ROS  Rhythm:regular Rate:Normal     Neuro/Psych negative neurological ROS  negative psych ROS   GI/Hepatic negative GI ROS, Neg liver ROS,   Endo/Other  negative endocrine ROSMorbid obesity  Renal/GU negative Renal ROS     Musculoskeletal   Abdominal   Peds  Hematology negative hematology ROS (+)   Anesthesia Other Findings   Reproductive/Obstetrics (+) Pregnancy                           Anesthesia Physical Anesthesia Plan  ASA: III  Anesthesia Plan: Epidural   Post-op Pain Management:    Induction:   Airway Management Planned:   Additional Equipment:   Intra-op Plan:   Post-operative Plan:   Informed Consent: I have reviewed the patients History and Physical, chart, labs and discussed the procedure including the risks, benefits and alternatives for the proposed anesthesia with the patient or authorized representative who has indicated his/her understanding and acceptance.     Plan Discussed with:   Anesthesia Plan Comments:         Anesthesia Quick Evaluation  

## 2011-09-08 NOTE — Op Note (Signed)
Preop Diagnosis: Desires Sterilzation   Postop Diagnosis: Desires Sterilzation   Procedure: POST PARTUM TUBAL LIGATION   Anesthesia: Epidural   Anesthesiologist: Creta Levin, MD   Attending: Osborn Coho, MD   Assistant: N/a  Findings: normal appearing bilateral ovaries and fallopian tubes.  Pathology: portions of bilateral fallopian tubes  Fluids:  See flowsheet  UOP: See flowsheet  EBL: Minimal  Complications: None  Procedure: The patient was taken to the operating room after the risks, benefits, alternative, complications, treatment options, and expected outcomes were discussed with the patient. The patient verbalized understanding, the patient concurred with the proposed plan and consent signed and witnessed. The patient was taken to the Operating Room, identified as Connie Taylor and the procedure verified as bilateral tubal ligation. A Time Out was held and the above information confirmed.  The patient was given a surgical level via the epidural and prepped, draped, and catheterized in the normal, sterile fashion in the supine position.  A 10mm incision was made at the umbilicus and carried down to the underlying layer of fascia and peritoneum entered without difficulty.  The right fallopian tube was indentified and carried out to its fimbriated end and ligated with 2-0 plain suture twice.  The tube was then excised and sent to pathology.  The same was done on the contralateral side.    The fascia was then repaired with 0 vicryl via a running stitch and the incision was closed with 3-0 monocryl via subcuticular sutures.  Instrument, sponge, and needle counts were correct, patient tolerated the procedure well and was returned to the recovery room in good condition.

## 2011-09-09 ENCOUNTER — Encounter (HOSPITAL_COMMUNITY): Payer: Self-pay | Admitting: Obstetrics and Gynecology

## 2011-09-09 ENCOUNTER — Encounter: Payer: 59 | Admitting: Obstetrics and Gynecology

## 2011-09-09 MED ORDER — IBUPROFEN 600 MG PO TABS: 600.0000 mg | ORAL_TABLET | Freq: Four times a day (QID) | ORAL | Status: AC

## 2011-09-09 MED ORDER — LABETALOL HCL 100 MG PO TABS: 200.0000 mg | ORAL_TABLET | Freq: Two times a day (BID) | ORAL | Status: DC

## 2011-09-09 NOTE — Anesthesia Postprocedure Evaluation (Signed)
  Anesthesia Post Note  Patient: Connie Taylor  Procedure(s) Performed: Procedure(s) (LRB): POST PARTUM TUBAL LIGATION (Bilateral)  Anesthesia type: Epidural  Patient location: Mother/Baby  Post pain: Pain level controlled  Post assessment: Post-op Vital signs reviewed  Last Vitals:  Filed Vitals:   09/09/11 0430  BP: 121/79  Pulse: 87  Temp: 36.8 C  Resp: 18    Post vital signs: Reviewed  Level of consciousness:alert  Complications: No apparent anesthesia complications

## 2011-09-09 NOTE — Progress Notes (Signed)
Patient ID: Connie Taylor, female   DOB: 1985/09/10, 26 y.o.   MRN: [id number] Post Partum Day 1, s/p VAVD, and POD1 BTL  Subjective: no complaints, up ad lib without syncope, voiding, tolerating PO, + flatus  Pain well controlled with po meds, denies PIH sx's  BF well Mood stable, bonding well Desires d/c today    Objective: Blood pressure 121/79, pulse 87, temperature 98.2 F (36.8 C), temperature source Oral, resp. rate 18, height 5\' 5"  (1.651 m), weight 284 lb (128.822 kg), last menstrual period 11/16/2010, SpO2 99.00%, unknown if currently breastfeeding. BP stable, one outlier yest afternoon of 155/87, a few systolic of 140's, average 120-130's. And diastolic avg 70's.   Physical Exam:  General: alert and no distress Lungs: CTAB Heart: RRR Breasts: WNL  Lochia: appropriate Uterine Fundus: firm Perineum: WNL DVT Evaluation: No evidence of DVT seen on physical exam. Negative Homan's sign. No significant calf/ankle edema.   Basename 09/08/11 1529 09/08/11 0525  HGB 9.5* 9.9*  HCT 30.0* 31.4*    Assessment/Plan: Discharge home, Breastfeeding, Lactation consult and Contraception PP BTL done Mild anemia  - stable, FE ordered  GHTN - PIH labs and UA nl  Will obs until after lunch and recheck BP's, and d/w Dr Normand Sloop Will have smart start to check pt's BP tmrw And pt to f/u in office in 1-2wks for BP check Will prob d/c home on 200mg  labetalol BID       LOS: 2 days   Cindy Fullman M 09/09/2011, 10:50 AM

## 2011-09-10 ENCOUNTER — Encounter: Payer: Self-pay | Admitting: Obstetrics and Gynecology

## 2011-09-10 NOTE — Anesthesia Postprocedure Evaluation (Signed)
Anesthesia Post Note  Patient: Connie Taylor  Procedure(s) Performed: * No procedures listed *  Anesthesia type: Epidural  Patient location: Mother/Baby  Post pain: Pain level controlled  Post assessment: Post-op Vital signs reviewed  Last Vitals: There were no vitals filed for this visit.  Post vital signs: Reviewed  Level of consciousness: awake  Complications: No apparent anesthesia complications

## 2011-09-13 ENCOUNTER — Telehealth: Payer: Self-pay | Admitting: Obstetrics and Gynecology

## 2011-09-13 NOTE — Telephone Encounter (Signed)
TC from [facility].   Pt's BP 148/84 (L) and 140/82 (R).  S/P vag delivery 09/08/11.   Taking Labetalol 200 mg bid.   No PIH sx. Dr [facility] to be made aware.

## 2011-09-13 NOTE — Telephone Encounter (Signed)
TC to pt.   Per Dr Maryellen Pile, pt to remain on Labetalol until PP visit at 6 weeks.  Sched 10/20/11.  To call with nay sx of PIH or any concerns.  Pt verbalizes comprehension.

## 2011-09-16 ENCOUNTER — Telehealth: Payer: Self-pay | Admitting: Obstetrics and Gynecology

## 2011-09-16 NOTE — Telephone Encounter (Signed)
Message copied by Delon Sacramento on Thu Sep 16, 2011  5:19 PM ------      Message from: Malissa Hippo.      Created: Thu Sep 16, 2011  2:51 PM      Regarding: PP BP check       Pt had vag del on 7-10, w CHTN/GHTN on labetalol. 200mg  BID      She was supposed to have smart start BP check on 7-15, and f/u in office next week.       She doesn't have appointment scheduled until August.      Can you please call her to see if she had BP check and to come in for PP visit to check BP, and ask if she is still taking labetalol?             Let me know what you find out.             Thanks      SL

## 2011-09-16 NOTE — Discharge Summary (Signed)
Obstetric Discharge Summary Reason for Admission: induction of labor and secondary to CHTN/GHTN on PO labetalol Prenatal Procedures: NST, ultrasound and BPP, PIH labs, 24 hour urine Intrapartum Procedures: vacuum, GBS prophylaxis and cytotec, AROM, FSE, IUPC, epidural,  Postpartum Procedures: PP BTL per Dr. Normand Sloop Complications-Operative and Postpartum: anemia    Hemoglobin  Date Value Range Status  09/08/2011 9.5* 12.0 - 15.0 g/dL Final     HCT  Date Value Range Status  09/08/2011 30.0* 36.0 - 46.0 % Final    Hospital Course:  Hospital Course: Admitted for IOL secondary to BP elevation, on 400mg  labetalol TID. CHTN w GHTN exacerbation. PIH labs were normal and pt had no sx's pre-eclampsia. Labor started after 1 dose of Cytotec PV.  rcvd PCN for GBS prophylaxis.  AROM, FSE and IUPC placed. Pt rcv'd epidural. Progressed to fully dilated, w severe variable decels. Delivery was performed by Dr Pennie Rushing w Kiwi vacuum, tight nuchal cord was cut on the perineum. Patient and baby tolerated the procedure without difficulty, with no laceration noted. Infant to FTN. Mother and infant then had an uncomplicated postpartum course, with breast feeding going well. She had a BTL on PP day 1 per Dr Normand Sloop without complications. BP remained stable and she was on 200mg  labetalol BID. Mom's physical exam was WNL, and she was discharged home in stable condition. Contraception plan was rcv'd BTL.  She received adequate benefit from po pain medications.  Discharge Diagnoses: Term Pregnancy-delivered and GHTN on labetalol, s/p PP BTL, anemia  Discharge Information: Date: 09/16/2011 Activity: pelvic rest Diet: routine and iron rich Medications:  Medication List  As of 09/16/2011  2:55 PM   START taking these medications         ibuprofen 600 MG tablet   Commonly known as: ADVIL,MOTRIN   Take 1 tablet (600 mg total) by mouth every 6 (six) hours.         CHANGE how you take these medications         labetalol 100 MG tablet   Commonly known as: NORMODYNE   Take 2 tablets (200 mg total) by mouth 2 (two) times daily.   What changed: how often to take the med         CONTINUE taking these medications         acetaminophen-codeine 300-30 MG per tablet   Commonly known as: TYLENOL #3      IRON COMPLEX PO      prenatal multivitamin Tabs          Where to get your medications    These are the prescriptions that you need to pick up. We sent them to a specific pharmacy, so you will need to go there to get them.   [id number] - [place], [place] - [address] AT [facility] [address]    [address] [place] [address]    Phone: [phone]        ibuprofen 600 MG tablet         You may get these medications from any pharmacy.         labetalol 100 MG tablet           Condition: stable Instructions: refer to practice specific booklet Discharge to: home Follow-up Information    Follow up with [facility] in 2 weeks. (A nurse will call you to arrange a blood pressure check on Monday. Call to schedule appointment in the office in 2 weeks. )  Newborn Data: Live born  Information for the patient's newborn:  Jearld Pies [[id number]]  female ; APGAR , 3, 9 ; weight ; 7#7oz, cord ph 7.23  Home with mother.  Laronda Lisby M 09/16/2011, 2:55 PM

## 2011-09-16 NOTE — Telephone Encounter (Signed)
Tc to pt regarding msg below.  Pt states, SS RN did come out to check BP and is still currently taking the Labetolol, pt says was told BP reported to [facility], she is to continue taking the Labetolol and come back into office @ 6wks PP visit.  Pt denies any sxs and will call if any develop, SL made aware.

## 2011-10-14 ENCOUNTER — Encounter: Payer: Self-pay | Admitting: Obstetrics and Gynecology

## 2011-10-14 ENCOUNTER — Ambulatory Visit (INDEPENDENT_AMBULATORY_CARE_PROVIDER_SITE_OTHER): Payer: 59 | Admitting: Obstetrics and Gynecology

## 2011-10-14 VITALS — BP 120/80 | Resp 16 | Wt 262.0 lb

## 2011-10-14 DIAGNOSIS — IMO0001 Reserved for inherently not codable concepts without codable children: Secondary | ICD-10-CM

## 2011-10-14 NOTE — Progress Notes (Signed)
Date of delivery: 09/08/11 Female Name: Connie Taylor Vaginal delivery:yes Cesarean section:no Tubal ligation:yes GDM:no Breast Feeding:yes Bottle Feeding:no Post-Partum Blues:no Abnormal pap:no Normal GU function: yes Normal GI function:yes Returning to work:yes 10/18/11 EPDS: 0  Subjective:     Connie Taylor Cart is a 26 y.o. female who presents for a postpartum visit.   She is 5 weeks PP postpartum following a KiWi Vacuum Delivery.  I have fully reviewed the prenatal and intrapartum course.  The delivery was at 38 gestational weeks.  Outcome: spontaneous vaginal delivery with Kiwi Vacuum.  Anesthesia: epidural. Postpartum course has been complicated by continued elevation in B/P immediate PP and patient is taking Labetelol 200mg  BID po daily since delivery. Present B/P 120/80 at time of visit. Patient states "That she had been taking B/P medication for 6 weeks after last baby and then discontinued medications and would like to try to do this with this pregnancy and will f/u one week after d/c medicatio  For b/p check".  Baby's course has been uncomplicated.  Baby is feeding by breast.  Bleeding commenced 1st menses this morning prior to visit.  Bowel function is normal.  Bladder function is normal.  Patient is not sexually active.  Contraception method is tubal ligation with Dr Su Hilt 09/08/11 Postpartum depression screening: negative.  Review of Systems A comprehensive review of systems was negative.   Objective:    BP 120/80  Resp 16  Wt 262 lb (118.842 kg)  LMP 10/14/2011  Breastfeeding? Yes  General:  alert, cooperative and appears stated age   Breasts:  inspection negative, no nipple discharge or bleeding, no masses or nodularity palpable  Lungs: clear to auscultation bilaterally  Heart:  regular rate and rhythm, S1, S2 normal, no murmur, click, rub or gallop  Abdomen: soft, non-tender; bowel sounds normal; no masses,  no organomegaly   Vulva:  normal  Vagina: normal  vagina  Cervix:  anteverted  Corpus: normal  Adnexa:  no mass, fullness, tenderness  Rectal Exam: Normal rectovaginal exam        Assessment:    5 weeks postpartum exam.  Pap smear not done at today's visit.   Plan:    1. Contraception: Not required as had BTL 09/08/11 2. Breast feeding successfully 3. Follow up in: 10 days  or as needed. 4. Will antihypertensive medication for next 7 days and wants to stop medication to see if she will be normotensive after 6 weeks PP     Advised to return in 10 - 14 days for B/P check.    Elin Fenley, CNM. 10/14/2011 9:37 AM

## 2011-10-18 ENCOUNTER — Encounter: Payer: 59 | Admitting: Obstetrics and Gynecology

## 2011-10-20 ENCOUNTER — Encounter: Payer: 59 | Admitting: Obstetrics and Gynecology

## 2011-10-26 ENCOUNTER — Encounter: Payer: 59 | Admitting: Obstetrics and Gynecology

## 2011-10-29 ENCOUNTER — Encounter: Payer: 59 | Admitting: Obstetrics and Gynecology

## 2011-12-13 ENCOUNTER — Ambulatory Visit (INDEPENDENT_AMBULATORY_CARE_PROVIDER_SITE_OTHER): Payer: 59 | Admitting: Obstetrics and Gynecology

## 2011-12-13 ENCOUNTER — Encounter: Payer: Self-pay | Admitting: Obstetrics and Gynecology

## 2011-12-13 ENCOUNTER — Telehealth: Payer: Self-pay | Admitting: Obstetrics and Gynecology

## 2011-12-13 VITALS — BP 122/80 | HR 76 | Wt 274.0 lb

## 2011-12-13 DIAGNOSIS — N921 Excessive and frequent menstruation with irregular cycle: Secondary | ICD-10-CM

## 2011-12-13 DIAGNOSIS — N920 Excessive and frequent menstruation with regular cycle: Secondary | ICD-10-CM

## 2011-12-13 NOTE — Progress Notes (Signed)
26 YO S/P Vacuum Assisted Vaginal Delivery 09/08/11, currently breastfeeding complains of heavy vaginal bleeding x 6 days accompanied by clots.  Patient has had tubal sterilization and a regular period in September 10th had a 4 day period with pad change every 3 hours.  This month changing pad every hour and a half,  Breast feeds and pumps 6 times a day,  before returning to work was  breastfeeding every 2 hours with moderate cramping 6/10.  Denies intermenstrual bleeding or post-coital bleeding nor vaginitis/UTI or bowel symptoms.  O:  UPT-negative     Abdomen: soft, non-tender     Pelvic: EGBUS-wnl, vagina-small blood, cervix-no lesions, uterus/adnexae-normal  A:  Increased Menstrual Flow  P: RTO-as scheduled or prn  Izabelle Daus, PA-C

## 2011-12-13 NOTE — Telephone Encounter (Signed)
TC to pt.  States LMP 12/09/11.  Is much heavier than usual. Changing pad q 1-1 1/2 hr.  Scheduled for eval with EP today.

## 2011-12-13 NOTE — Progress Notes (Signed)
When did bleeding start: thursday  How often changing pad/tampon: every hour Bleeding Disorders: no Cramping: no Contraception: yes  btl Fibroids: no Hormone Therapy: no New Medications: no Menopausal Symptoms: no Vag. Discharge: no Abdominal Pain: no Increased Stress: no

## 2013-08-13 ENCOUNTER — Ambulatory Visit (INDEPENDENT_AMBULATORY_CARE_PROVIDER_SITE_OTHER): Payer: 59 | Admitting: Medical

## 2013-08-13 ENCOUNTER — Encounter: Payer: Self-pay | Admitting: Medical

## 2013-08-13 VITALS — BP 148/98 | HR 88 | Temp 98.5°F | Resp 18 | Ht 66.25 in | Wt 273.0 lb

## 2013-08-13 DIAGNOSIS — Z0000 Encounter for general adult medical examination without abnormal findings: Secondary | ICD-10-CM

## 2013-08-13 DIAGNOSIS — E669 Obesity, unspecified: Secondary | ICD-10-CM

## 2013-08-13 DIAGNOSIS — R030 Elevated blood-pressure reading, without diagnosis of hypertension: Secondary | ICD-10-CM

## 2013-08-13 LAB — POCT URINALYSIS DIPSTICK
Bilirubin, UA: NEGATIVE
Blood, UA: NEGATIVE
GLUCOSE UA: NEGATIVE
KETONES UA: NEGATIVE
LEUKOCYTES UA: NEGATIVE
Nitrite, UA: NEGATIVE
PROTEIN UA: NEGATIVE
SPEC GRAV UA: 1.02
UROBILINOGEN UA: NEGATIVE
pH, UA: 5

## 2013-08-13 NOTE — Progress Notes (Signed)
Subjective:   HPI  Connie Taylor is a 28 y.o. female who presents for a complete physical.  New patient today  Medical care team includes:  Earnstine Regal, PA at [facility].   Preventative care: Last ophthalmology visit: don't know  Last dental visit: March 2015 Last colonoscopy: ? Last mammogram: ? 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  Last pregnancy 08/2011. Influenza: last year Pneumococcal:? Shingles/Zostavax:?  Concerns: None in particular  Reviewed their medical, surgical, family, social, medication, and allergy history and updated chart as appropriate.  Past Medical History  Diagnosis Date  . Chlamydia 4 years ago  . Anemia   . Hypertension     HTN during pregnancy  . Urinary tract infection   . Obesity     Past Surgical History  Procedure Laterality Date  . Tubal ligation  09/08/2011    Procedure: POST PARTUM TUBAL LIGATION;  Surgeon: Delice Lesch, MD;  Location: [facility];  Service: Gynecology;  Laterality: Bilateral;  . Wisdom tooth extraction      History   Social History  . Marital Status: Single    Spouse Name: N/A    Number of Children: N/A  . Years of Education: N/A   Occupational History  . Not on file.   Social History Main Topics  . Smoking status: Never Smoker   . Smokeless tobacco: Never Used  . Alcohol Use: No  . Drug Use: No  . Sexual Activity: Not Currently    Birth Control/ Protection: Surgical     Comment: BTL   Other Topics Concern  . Not on file   Social History Narrative   Engaged, considering marriage 04/2014, works in billing at [employer], exercise - periodically as of 6/15    Family History  Problem Relation Age of Onset  . Seizures Father   . Cancer Father     liver  . Cirrhosis Father   . Alcohol abuse Father   . Hypertension Maternal Grandmother   . Diabetes Maternal Grandmother   . Hyperthyroidism  Maternal Grandmother   . Hypertension Maternal Grandfather   . Cancer Maternal Grandfather     ?type  . Anesthesia problems Neg Hx   . Heart disease Neg Hx     No current outpatient prescriptions on file.  No Known Allergies     Review of Systems Constitutional: -fever, -chills, -sweats, -unexpected weight change, -decreased appetite, -fatigue Allergy: -sneezing, -itching, -congestion Dermatology: -changing moles, --rash, -lumps ENT: -runny nose, -ear pain, -sore throat, -hoarseness, -sinus pain, -teeth pain, - ringing in ears, -hearing loss, -nosebleeds Cardiology: -chest pain, -palpitations, -swelling, -difficulty breathing when lying flat, -waking up short of breath Respiratory: -cough, -shortness of breath, -difficulty breathing with exercise or exertion, -wheezing, -coughing up blood Gastroenterology: -abdominal pain, -nausea, -vomiting, -diarrhea, -constipation, -blood in stool, -changes in bowel movement, -difficulty swallowing or eating Hematology: -bleeding, -bruising  Musculoskeletal: -joint aches, -muscle aches, -joint swelling, -back pain, -neck pain, -cramping, -changes in gait Ophthalmology: denies vision changes, eye redness, itching, discharge Urology: -burning with urination, -difficulty urinating, -blood in urine, -urinary frequency, -urgency, -incontinence Neurology: -headache, -weakness, -tingling, -numbness, -memory loss, -falls, -dizziness Psychology: -depressed mood, -agitation, -sleep problems     Objective:   Physical Exam  BP 148/98  Pulse 88  Temp(Src) 98.5 F (36.9 C) (Oral)  Resp 18  Ht 5' 6.25" (1.683 m)  Wt 273 lb (123.832 kg)  BMI 43.72 kg/m2  Breastfeeding? No  General appearance: alert, no distress, WD/WN, obese AA female Skin: multiple tattoos, left chest, mid and bilat upper back, forearms, no worrisome lesions HEENT: normocephalic, conjunctiva/corneas normal, sclerae anicteric, PERRLA, EOMi, nares patent, no discharge or erythema,  pharynx normal Oral cavity: MMM, tongue normal, teeth normal Neck: supple, no lymphadenopathy, no thyromegaly, no masses, normal ROM Chest: non tender, normal shape and expansion Heart: RRR, normal S1, S2, no murmurs Lungs: CTA bilaterally, no wheezes, rhonchi, or rales Abdomen: +bs, soft, non tender, non distended, no masses, no hepatomegaly, no splenomegaly, no bruits Back: non tender, normal ROM, no scoliosis Musculoskeletal: upper extremities non tender, no obvious deformity, normal ROM throughout, lower extremities non tender, no obvious deformity, normal ROM throughout Extremities: no edema, no cyanosis, no clubbing Pulses: 2+ symmetric, upper and lower extremities, normal cap refill Neurological: alert, oriented x 3, CN2-12 intact, strength normal upper extremities and lower extremities, sensation normal throughout, DTRs 2+ throughout, no cerebellar signs, gait normal Psychiatric: normal affect, behavior normal, pleasant  Breast/gyn/rectal - deferred to gyn   Assessment and Plan :    Encounter Diagnoses  Name Primary?  . Routine general medical examination at a health care facility Yes  . Elevated blood pressure reading without diagnosis of hypertension   . Obesity, unspecified       Physical exam - discussed healthy lifestyle, diet, exercise, preventative care, vaccinations, and addressed their concerns.  Handout given. See gynecologist yearly for routine care, dentist yearly Lab order she can take to her employer [facility] for free labs panel Elevated BP - she reports recent normal BP at health fair at work, usually runs normal per her report Obesity - discussed need to work on weight loss through diet and exercise  Follow-up pending labs

## 2013-09-19 ENCOUNTER — Ambulatory Visit: Payer: 59 | Admitting: Medical

## 2013-09-20 ENCOUNTER — Ambulatory Visit: Payer: 59 | Admitting: Medical

## 2013-09-26 ENCOUNTER — Encounter: Payer: Self-pay | Admitting: Medical

## 2013-09-26 ENCOUNTER — Ambulatory Visit (INDEPENDENT_AMBULATORY_CARE_PROVIDER_SITE_OTHER): Payer: 59 | Admitting: Medical

## 2013-09-26 VITALS — BP 130/80 | HR 90 | Resp 14 | Wt 269.0 lb

## 2013-09-26 DIAGNOSIS — R7303 Prediabetes: Secondary | ICD-10-CM

## 2013-09-26 DIAGNOSIS — E669 Obesity, unspecified: Secondary | ICD-10-CM

## 2013-09-26 DIAGNOSIS — R030 Elevated blood-pressure reading, without diagnosis of hypertension: Secondary | ICD-10-CM

## 2013-09-26 DIAGNOSIS — R7309 Other abnormal glucose: Secondary | ICD-10-CM

## 2013-09-26 DIAGNOSIS — E786 Lipoprotein deficiency: Secondary | ICD-10-CM

## 2013-09-26 DIAGNOSIS — D649 Anemia, unspecified: Secondary | ICD-10-CM

## 2013-09-26 MED ORDER — PHENTERMINE HCL 37.5 MG PO TABS: 37.5000 mg | ORAL_TABLET | Freq: Every day | ORAL | Status: DC

## 2013-09-26 MED ORDER — POLYSACCHARIDE IRON COMPLEX 150 MG PO CAPS: 150.0000 mg | ORAL_CAPSULE | Freq: Two times a day (BID) | ORAL | Status: DC

## 2013-09-26 NOTE — Progress Notes (Signed)
Subjective: Here for f/u.   I saw her in June for new patient physical.  She works at [employer] and just had labs done a few days ago.   Here for f/u on elevated BP and lab results.   Anemia - she notes anemia since age 28yo.  Periods are heavy, got heavier 2 years ago after tubal ligation.   Sees gynecology.  She denies any recent pelvic ultrasound.  No blood in stool.  No bruising.  Not taking iron or multivitain.    Elevated BP -elevated blood pressures in prior pregnancies, but not without pregnancy  HDL low - no prior history of this, since last visit she has made diet and exercise changes  HgbA1C 6.3% - no prior history of this, her weaknesses is sweets  Obesity - has started to make changes since last visit does have a long history of weight issues. Has tried fad diets, has went up and down with weight over time. She has lost 40 pounds with Weight Watchers in the past   Objective: Filed Vitals:   09/26/13 0805  BP: 130/80  Pulse: 90  Resp: 14    General appearence: alert, no distress, WD/WN   Assessment: Encounter Diagnoses  Name Primary?  Marland Kitchen Anemia, unspecified Yes  . Elevated blood pressure reading without diagnosis of hypertension   . Low HDL (under 40)   . Borderline diabetes   . Obesity, unspecified    Plan: Anemia-begin new iron, followup with gynecology in September to help evaluate for fibroids or other cause of heavy periods  Elevated blood pressure-will address with weight loss efforts  Low HDL-discussed the significance of this, discussed diet and exercise  Borderline diabetes, obesity-begin phentermine, discussed risk and benefits of medication, discussed exercise, weight loss goals, diet  F/u 47mo.

## 2013-09-26 NOTE — Patient Instructions (Signed)
  Thank you for giving me the opportunity to serve you today.    Your diagnosis today includes: Encounter Diagnoses  Name Primary?  Marland Kitchen Anemia, unspecified Yes  . Elevated blood pressure reading without diagnosis of hypertension   . Low HDL (under 40)   . Borderline diabetes   . Obesity, unspecified      Specific recommendations today include:  Begin Nu Iron twice daily for anemia.  See gynecology in September and ask about anemia, fibroids?  Diet  Increase your water intake, get at least 64 ounces of water daily  Eat 3-4 fruits daily  Eat plenty of vegetables throughout the day, preferably each meal  Eat good sources of grains such as oatmeal, barley, whole grain pasta, whole grain bread, but limit the serving size to 1 cup of oatmeal or pasta per meal or 2 slices of bread per meal  We don't need to meat at each meal, however if you do eat meat, limit serving size to the size of your palm, and eat chicken fish or [place], lean cuts of meat  Eat beans every day as this is a good nutrient source and helps to curb appetite  Consider using a program such as Weight Watchers  Consider using a Smart phone app such as My Fitness [employer] or [employer] to track your calories and progress   Things to limit or avoid:  Avoid fast food, fried foods, fatty foods  Limit sweets, ice cream, cake and other baked goods  Avoid soda, beer, alcohol, sweet tea  Exercise  You need to be exercising most days of the week for 30-45 minutes or more  Good forms of exercise include walking, hiking, stationary bike or bicycling outside, lap swimming, aerobics class, dance, Zumba  Consider getting a trainer at a gym to help with exercise  Medication  Begin Phentermine weight loss medication once daily in the morning   Consider weighing yourself daily to keep track of your weight   Return in 1-2 months.

## 2013-10-29 ENCOUNTER — Encounter: Payer: Self-pay | Admitting: Medical

## 2013-10-29 ENCOUNTER — Ambulatory Visit (INDEPENDENT_AMBULATORY_CARE_PROVIDER_SITE_OTHER): Payer: 59 | Admitting: Medical

## 2013-10-29 VITALS — BP 120/78 | HR 68 | Temp 98.1°F | Resp 16 | Wt 258.0 lb

## 2013-10-29 DIAGNOSIS — E669 Obesity, unspecified: Secondary | ICD-10-CM

## 2013-10-29 DIAGNOSIS — D649 Anemia, unspecified: Secondary | ICD-10-CM

## 2013-10-29 NOTE — Progress Notes (Signed)
Subjective:     Connie Taylor is a 28 y.o. female here for recheck.  Obesity - we recently started Phentermine.  She is taking Phentermine each morning.  No side effects reported.   Is losing weight.   She notes making significant changes in her routine.  Exercise - daily except for Sundays.  Does some zumba in the afternoons, does exercise on her lunch break.   Getting about an hour of exercise daily.  Diet - has made big changes . Drinking about a gallon of water daily.   Is baking a lot of things, not deep frying.  She bought a portion control plate on [employer] which limits her serving sizes.  She is eating 3 meals with snacks, and is really happy she is seeing improvements.  She is also checking BPs as home and they have all been looking good.    She notes that she is taking the Nu Iron, was constipated the first week, but after taking with orange juice daily, doing fine.  Taking once daily.  Has heavy periods, hx/o tubal ligation, sees gynecology in September.  No other bleeding or bruising. No fatigue, no ice cravings.   The following portions of the patient's history were reviewed and updated as appropriate: allergies, current medications, past family history, past medical history, past social history, past surgical history and problem list.  Review of Systems As in subjective  Past Medical History  Diagnosis Date  . Chlamydia 4 years ago  . Anemia   . Hypertension     HTN during pregnancy  . Urinary tract infection   . Obesity       Objective:   BP 120/78  Pulse 68  Temp(Src) 98.1 F (36.7 C) (Oral)  Resp 16  Wt 258 lb (117.028 kg)   BP Readings from Last 3 Encounters:  10/29/13 120/78  09/26/13 130/80  08/13/13 148/98   Wt Readings from Last 3 Encounters:  10/29/13 258 lb (117.028 kg)  09/26/13 269 lb (122.018 kg)  08/13/13 273 lb (123.832 kg)   General appearance: alert, no distress, WD/WN Heart: RRR, normal S1, S2, no murmurs Lungs: CTA bilaterally, no wheezes,  rhonchi, or rales Pulses: 2+ symmetric, upper and lower extremities, normal cap refill Ext: no edema   Assessment:   Encounter Diagnoses  Name Primary?  . Obesity, unspecified Yes  . Anemia, unspecified      Plan:     Glad to see she is losing weight, is making very good lifestyle changes with diet, exercise, is motivated, and doing fine on Phentermine without c/o.  Advised she call in 80mo with weight reading and for refill.   Anemia - c/t Nu Iron, healthy diet, plan to recheck lab in 2 mo, see gynecology in September as planned.  Get flu shot at work as planned.

## 2013-10-29 NOTE — Progress Notes (Deleted)
   Subjective:    Patient ID: Connie Taylor, female    DOB: [birth year], 28 y.o.   MRN: [id number]  HPI    Review of Systems     Objective:   Physical Exam        Assessment & Plan:

## 2013-12-10 ENCOUNTER — Telehealth: Payer: Self-pay | Admitting: Medical

## 2013-12-10 NOTE — Telephone Encounter (Signed)
Form ready, need f/u appt on weigth at end of the month.

## 2013-12-10 NOTE — Telephone Encounter (Signed)
Pt informed form ready, she asked that we fax it.  Which was done

## 2013-12-17 ENCOUNTER — Telehealth: Payer: Self-pay | Admitting: Medical

## 2013-12-17 NOTE — Telephone Encounter (Signed)
I left the patient a voicemail letting her know that she will need a OV

## 2013-12-17 NOTE — Telephone Encounter (Signed)
Last visit was August, this medication requires OV q1-2 months.  So, she is due for OV f/u.

## 2013-12-17 NOTE — Telephone Encounter (Signed)
Pt still needs her Rx still. See message 10/12 (Pt called to report her weight (241 lbs) and request refill on Phentermine. Call [phone] when script ready for pick up)

## 2013-12-26 ENCOUNTER — Institutional Professional Consult (permissible substitution): Payer: 59 | Admitting: Medical

## 2013-12-31 ENCOUNTER — Encounter: Payer: Self-pay | Admitting: Medical

## 2014-01-31 ENCOUNTER — Encounter: Payer: Self-pay | Admitting: Medical

## 2014-01-31 ENCOUNTER — Ambulatory Visit (INDEPENDENT_AMBULATORY_CARE_PROVIDER_SITE_OTHER): Payer: 59 | Admitting: Medical

## 2014-01-31 VITALS — BP 112/80 | HR 62 | Temp 97.9°F | Resp 16 | Wt 229.0 lb

## 2014-01-31 DIAGNOSIS — E669 Obesity, unspecified: Secondary | ICD-10-CM

## 2014-01-31 DIAGNOSIS — R7301 Impaired fasting glucose: Secondary | ICD-10-CM

## 2014-01-31 DIAGNOSIS — D509 Iron deficiency anemia, unspecified: Secondary | ICD-10-CM

## 2014-01-31 MED ORDER — PHENTERMINE HCL 37.5 MG PO TABS: 37.5000 mg | ORAL_TABLET | Freq: Every day | ORAL | Status: DC

## 2014-01-31 NOTE — Progress Notes (Signed)
Subjective: Here for discussion on weight loss.  Wants HgbA1C rechecked.   It was elevated in 09/2013.  Wants refill on weight loss medications.   She notes that she has lost weight on the medication.   She is exercising with zumba, walking elliptical.  Doing strength training 3 day per week.  Diet - eating lean meats, brown rice, sweet potatoes, smoothies, using my fitness pal app on phone to track calories.  Avoiding soda, drinking mostly water.   She took phentermine daily the first months, then the month before last every other day, then none last month.  Wants to restart the medication as it worked to help her get weight off. Wants to get to 9 or 10 pant size.  Denies any side effects of medication.   At home, weights were around 236lb, but not weighting every day.    Anemia - still taking Nu iron once daily.  Saw gyn recently, but periods have actually gotten more normal, less heavy.  Taking OTC B12 pills too.   Otherwise no c/o.  ROS as in subjective   Objective: BP 112/80 mmHg  Pulse 62  Temp(Src) 97.9 F (36.6 C) (Oral)  Resp 16  Wt 229 lb (103.874 kg) Wt Readings from Last 3 Encounters:  01/31/14 229 lb (103.874 kg)  10/29/13 258 lb (117.028 kg)  09/26/13 269 lb (122.018 kg)   BP Readings from Last 3 Encounters:  01/31/14 112/80  10/29/13 120/78  09/26/13 130/80    General appearance: alert, no distress, WD/WN Neck: supple, no lymphadenopathy, no thyromegaly, no masses Heart: RRR, normal S1, S2, no murmurs Lungs: CTA bilaterally, no wheezes, rhonchi, or rales Pulses: 2+ symmetric, upper and lower extremities, normal cap refill Ext: no edema  Assessment: Encounter Diagnoses  Name Primary?  . Iron deficiency anemia Yes  . Obesity   . Impaired fasting blood sugar     Plan: Iron deficiency anemia - repeat lab ([facility]), compliant with iron, periods have been much improved since losing weight  Obesity - congratulated on weight loss so far.  C/t healthy diet and  exercise, c/t Phentermine.  Recheck 1-2 mo  Impaired fasting glucose - she will go for repeat lab ([facility]).    Script given for labs at [employer]

## 2014-02-06 ENCOUNTER — Telehealth: Payer: Self-pay | Admitting: Medical

## 2014-02-06 NOTE — Telephone Encounter (Signed)
Labs show stable anemia, but despite her taking iron, iron and iron stores are low.  She may be a poor observer of iron.   If she is taking iron daily, it maybe time to do an iron IV infusion instead.   See if agreeable.

## 2014-02-07 NOTE — Telephone Encounter (Signed)
Unable to leave message

## 2014-02-08 ENCOUNTER — Encounter: Payer: Self-pay | Admitting: Medical

## 2014-02-11 NOTE — Telephone Encounter (Signed)
LMOM TO CB. CLS 

## 2014-02-13 NOTE — Telephone Encounter (Signed)
Patient would like to know is there anything else that she can do first before having a iron infusion. She said that she is taking iron OTC once a day. She not taking the Rx you sent to the pharmacy. She said can she take 2 a day and see if that helps.

## 2014-02-14 NOTE — Telephone Encounter (Signed)
Patient was informed of Dorothea Ogle PA message

## 2014-02-14 NOTE — Telephone Encounter (Signed)
She can take iron 3 times daily and recheck blood counts in a month

## 2014-03-01 DIAGNOSIS — Z9289 Personal history of other medical treatment: Secondary | ICD-10-CM

## 2014-03-01 HISTORY — DX: Personal history of other medical treatment: Z92.89

## 2014-08-15 ENCOUNTER — Ambulatory Visit (INDEPENDENT_AMBULATORY_CARE_PROVIDER_SITE_OTHER): Payer: 59 | Admitting: Medical

## 2014-08-15 ENCOUNTER — Encounter: Payer: Self-pay | Admitting: Medical

## 2014-08-15 VITALS — BP 130/72 | HR 74 | Temp 98.1°F | Resp 15 | Wt 236.0 lb

## 2014-08-15 DIAGNOSIS — E669 Obesity, unspecified: Secondary | ICD-10-CM

## 2014-08-15 DIAGNOSIS — D509 Iron deficiency anemia, unspecified: Secondary | ICD-10-CM

## 2014-08-15 DIAGNOSIS — R5383 Other fatigue: Secondary | ICD-10-CM | POA: Diagnosis not present

## 2014-08-15 MED ORDER — PHENTERMINE HCL 37.5 MG PO TABS: 37.5000 mg | ORAL_TABLET | Freq: Every day | ORAL | Status: DC

## 2014-08-15 NOTE — Progress Notes (Signed)
Subjective: Here for recheck on a few things.    Obesity - when on phentermine this past 31mo from last visit did fine with appetite suppression.  She had to stop phentermine some since last visit, had too much other stuff going on, Hurt leg exercising in the past few months on treadmill, got of the bandwagon with exercise and diet.  Has recently jointed [employer].  Doing combination of walking, zumba, pump class.  Uses [employer] weight loss program through job, supportive program.  Doing 1500 calories on my fitness pal daily.  Has fit bit she monitors, and doing 15,000 steps daily.   Lately hasn't been as fatigued as prior, no cold feeling so thinks iron levels are back up.  Taking iron OTC 3 tablets daily with orange juice since last visits.  Takes a multivitamin as well.     Otherwise no c/o.  ROS as in subjective   Objective: BP 130/72 mmHg  Pulse 74  Temp(Src) 98.1 F (36.7 C) (Oral)  Resp 15  Wt 236 lb (107.049 kg)  LMP 08/05/2014   Wt Readings from Last 3 Encounters:  08/15/14 236 lb (107.049 kg)  01/31/14 229 lb (103.874 kg)  10/29/13 258 lb (117.028 kg)   BP Readings from Last 3 Encounters:  08/15/14 130/72  01/31/14 112/80  10/29/13 120/78    General appearance: alert, no distress, WD/WN Neck: supple, no lymphadenopathy, no thyromegaly, no masses Heart: RRR, normal S1, S2, no murmurs Lungs: CTA bilaterally, no wheezes, rhonchi, or rales Pulses: 2+ symmetric, upper and lower extremities, normal cap refill Ext: no edema  Assessment: Encounter Diagnoses  Name Primary?  Marland Kitchen Anemia, iron deficiency Yes  . Obesity   . Other fatigue     Plan: Iron deficiency anemia - repeat lab ([facility]), compliant with iron, periods have been much improved since losing weight  Obesity - C/t healthy diet and exercise, c/t Phentermine, discussed risks/benefits of phentermine.  Recheck 1-2 mo  Impaired fasting glucose - she will go for repeat lab ([facility]).    Script given for labs at  [employer]

## 2014-11-19 ENCOUNTER — Encounter: Payer: Self-pay | Admitting: Medical

## 2014-11-19 ENCOUNTER — Ambulatory Visit (INDEPENDENT_AMBULATORY_CARE_PROVIDER_SITE_OTHER): Payer: 59 | Admitting: Medical

## 2014-11-19 VITALS — BP 130/88 | HR 74 | Temp 98.4°F | Resp 18 | Ht 65.0 in | Wt 252.4 lb

## 2014-11-19 DIAGNOSIS — Z713 Dietary counseling and surveillance: Secondary | ICD-10-CM

## 2014-11-19 DIAGNOSIS — Z8759 Personal history of other complications of pregnancy, childbirth and the puerperium: Secondary | ICD-10-CM

## 2014-11-19 DIAGNOSIS — Z8349 Family history of other endocrine, nutritional and metabolic diseases: Secondary | ICD-10-CM

## 2014-11-19 DIAGNOSIS — Z0000 Encounter for general adult medical examination without abnormal findings: Secondary | ICD-10-CM | POA: Diagnosis not present

## 2014-11-19 DIAGNOSIS — D509 Iron deficiency anemia, unspecified: Secondary | ICD-10-CM | POA: Diagnosis not present

## 2014-11-19 DIAGNOSIS — R7301 Impaired fasting glucose: Secondary | ICD-10-CM

## 2014-11-19 LAB — POCT URINALYSIS DIPSTICK
Bilirubin, UA: NEGATIVE
GLUCOSE UA: NEGATIVE
Ketones, UA: NEGATIVE
Leukocytes, UA: NEGATIVE
NITRITE UA: NEGATIVE
Protein, UA: NEGATIVE
Spec Grav, UA: >=1.03
UROBILINOGEN UA: NEGATIVE
pH, UA: 5.5

## 2014-11-19 NOTE — Progress Notes (Signed)
Subjective: Chief Complaint  Patient presents with  . Annual Exam    Here for physical  Gyn - sees gynecology, up to date on pap, hx/o tubal ligation  Obesity - has tried phentermine and a second medication prior for weight loss as prescriptions. Has spend 100s of dollars on trainer in the past, has made numerous attempts over the years and weight loss. Weight has always been an issue.   She will lose a little, but can't get a handle on weight.  medications don't seem to help.  She can't control cravings.  insurance won't pay for weight los meds but will pay for bariatric surgery.   She would like to consider consult for this.     No other c/o.    She is compliant with iron BID OTC.   Past Medical History  Diagnosis Date  . Chlamydia 4 years ago  . Anemia   . Hypertension     HTN during pregnancy  . Urinary tract infection   . Obesity     Past Surgical History  Procedure Laterality Date  . Tubal ligation  09/08/2011    Procedure: POST PARTUM TUBAL LIGATION;  Surgeon: Delice Lesch, MD;  Location: [facility];  Service: Gynecology;  Laterality: Bilateral;  . Wisdom tooth extraction      Social History   Social History  . Marital Status: Single    Spouse Name: N/A  . Number of Children: N/A  . Years of Education: N/A   Occupational History  . Not on file.   Social History Main Topics  . Smoking status: Never Smoker   . Smokeless tobacco: Never Used  . Alcohol Use: No  . Drug Use: No  . Sexual Activity: Not Currently    Birth Control/ Protection: Surgical     Comment: BTL   Other Topics Concern  . 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  Exercise - 3-4 days per week, eating healthy as of 10/2014    Family History  Problem Relation Age of Onset  . Seizures Father   . Cancer Father     liver  . Cirrhosis Father   . Alcohol abuse Father   .  Hypertension Maternal Grandmother   . Diabetes Maternal Grandmother   . Hyperthyroidism Maternal Grandmother   . Hypertension Maternal Grandfather   . Cancer Maternal Grandfather     ?type  . Anesthesia problems Neg Hx   . Heart disease Neg Hx      Current outpatient prescriptions:  .  iron polysaccharides (NU-IRON) 150 MG capsule, Take 1 capsule (150 mg total) by mouth 2 (two) times daily., Disp: 60 capsule, Rfl: 3 .  Multiple Vitamin (MULTIVITAMIN) tablet, Take 1 tablet by mouth daily., Disp: , Rfl:  .  phentermine (ADIPEX-P) 37.5 MG tablet, Take 1 tablet (37.5 mg total) by mouth daily before breakfast. (Patient not taking: Reported on 11/19/2014), Disp: 30 tablet, Rfl: 1  No Known Allergies   ROS as in subjective otherwise  Objective: BP 130/88 mmHg  Pulse 74  Temp(Src) 98.4 F (36.9 C) (Oral)  Resp 18  Ht 5\' 5"  (1.651 m)  Wt 252 lb 6.4 oz (114.488 kg)  BMI 42.00 kg/m2  Wt Readings from Last 3 Encounters:  11/19/14 252 lb 6.4 oz (114.488 kg)  08/15/14 236 lb (107.049 kg)  01/31/14 229 lb (103.874 kg)   General appearance: alert, no  distress, WD/WN, obese AA female Skin: multiple tattoos, left chest, mid and bilat upper back, forearms, tattoo of [place] on right outer upper arm, no worrisome lesions HEENT: normocephalic, conjunctiva/corneas normal, sclerae anicteric, PERRLA, EOMi, nares patent, no discharge or erythema, pharynx normal Oral cavity: MMM, tongue normal, teeth normal Neck: supple, no lymphadenopathy, no thyromegaly, no masses, normal ROM, no bruits Chest: non tender, normal shape and expansion Heart: RRR, normal S1, S2, no murmurs Lungs: CTA bilaterally, no wheezes, rhonchi, or rales Abdomen: +bs, soft, non tender, non distended, no masses, no hepatomegaly, no splenomegaly, no bruits Back: non tender, normal ROM, no scoliosis Musculoskeletal: upper extremities non tender, no obvious deformity, normal ROM throughout, lower extremities non tender, no obvious  deformity, normal ROM throughout Extremities: no edema, no cyanosis, no clubbing Pulses: 2+ symmetric, upper and lower extremities, normal cap refill Neurological: alert, oriented x 3, CN2-12 intact, strength normal upper extremities and lower extremities, sensation normal throughout, DTRs 2+ throughout, no cerebellar signs, gait normal Psychiatric: normal affect, behavior normal, pleasant  Breast/gyn/rectal - deferred to gyn   Assessment: Encounter Diagnoses  Name Primary?  . Encounter for health maintenance examination in adult Yes  . Morbid obesity   . Anemia, iron deficiency   . Impaired fasting blood sugar   . Family history of thyroid disease   . Encounter for weight loss counseling   . History of gestational hypertension     Plan: Physical exam - discussed healthy lifestyle, diet, exercise, preventative care, vaccinations, and addressed their concerns.   Advised they see a dentist yearly for routine dental care including hygiene visits twice yearly. See your gynecologist yearly for routine gynecological care.  Labs through her employer [facility].  I wrote script order for this  Iron deficiency anemia - c/t Iron BID OTC  Vaccinations: Discussed appropriate vaccines including Tdap, Influenza. She gets flu shot free at work. 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

## 2014-11-27 ENCOUNTER — Telehealth: Payer: Self-pay

## 2014-11-27 NOTE — Telephone Encounter (Signed)
Medical records sent out to [facility], [facility], and/or [facility] on 11/27/14

## 2014-12-03 ENCOUNTER — Telehealth: Payer: Self-pay | Admitting: Medical

## 2014-12-03 NOTE — Telephone Encounter (Signed)
Pt notified of results

## 2014-12-03 NOTE — Telephone Encounter (Signed)
Labs are stable.  Diabetes marker 6.0%, rest of labs fine.   I also received records from her visit with bariatric surgery center.

## 2014-12-05 ENCOUNTER — Encounter: Payer: Self-pay | Admitting: Medical

## 2015-01-30 HISTORY — PX: CHOLECYSTECTOMY: SHX55

## 2015-03-19 ENCOUNTER — Telehealth: Payer: Self-pay | Admitting: Medical

## 2015-03-19 NOTE — Telephone Encounter (Signed)
I reviewed request from 2 different pharmacies about nutrients supplements.  Did she request this?  The papers look questionable like this wasn't requested by her.

## 2015-03-19 NOTE — Telephone Encounter (Signed)
Pt states they are requested from [facility]. She does want them filled and has already verified them with insurance

## 2015-03-20 NOTE — Telephone Encounter (Signed)
Add the biometric data and fax form

## 2015-03-20 NOTE — Telephone Encounter (Signed)
Faxed back and forms completed

## 2015-11-26 ENCOUNTER — Encounter: Payer: Self-pay | Admitting: Medical

## 2015-11-26 ENCOUNTER — Ambulatory Visit (INDEPENDENT_AMBULATORY_CARE_PROVIDER_SITE_OTHER): Payer: 59 | Admitting: Medical

## 2015-11-26 VITALS — BP 102/80 | HR 84 | Temp 98.1°F | Ht 65.5 in | Wt 216.0 lb

## 2015-11-26 DIAGNOSIS — Z9889 Other specified postprocedural states: Secondary | ICD-10-CM

## 2015-11-26 DIAGNOSIS — Z23 Encounter for immunization: Secondary | ICD-10-CM

## 2015-11-26 DIAGNOSIS — Z8349 Family history of other endocrine, nutritional and metabolic diseases: Secondary | ICD-10-CM | POA: Diagnosis not present

## 2015-11-26 DIAGNOSIS — Z8759 Personal history of other complications of pregnancy, childbirth and the puerperium: Secondary | ICD-10-CM | POA: Diagnosis not present

## 2015-11-26 DIAGNOSIS — R7301 Impaired fasting glucose: Secondary | ICD-10-CM

## 2015-11-26 DIAGNOSIS — Z9884 Bariatric surgery status: Secondary | ICD-10-CM

## 2015-11-26 DIAGNOSIS — Z0000 Encounter for general adult medical examination without abnormal findings: Secondary | ICD-10-CM

## 2015-11-26 DIAGNOSIS — D509 Iron deficiency anemia, unspecified: Secondary | ICD-10-CM | POA: Diagnosis not present

## 2015-11-26 LAB — POC URINALSYSI DIPSTICK (AUTOMATED)
Bilirubin, UA: NEGATIVE
Glucose, UA: NEGATIVE
Ketones, UA: NEGATIVE
Leukocytes, UA: NEGATIVE
NITRITE UA: NEGATIVE
PROTEIN UA: NEGATIVE
RBC UA: NEGATIVE
UROBILINOGEN UA: 1
pH, UA: 6

## 2015-11-26 NOTE — Progress Notes (Signed)
Subjective: Chief Complaint  Patient presents with  . 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   Doing fine currently.  Eating healthy, exercising.  Has f/u with surgeon for 5 years s/p.    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  Goes to zumba every Saturday.  Whole family walks on Saturday  No other c/o.    She is compliant with iron once daily, bariatric vitamin daily   Past Medical History:  Diagnosis Date  . Anemia   . Chlamydia 4 years ago  . Hypertension    HTN during pregnancy  . Obesity   . Urinary tract infection     Past Surgical History:  Procedure Laterality Date  . CHOLECYSTECTOMY  01/2015   during gastric sleeve surgery  . GASTRIC BYPASS  02/25/2016   sleeve procedure, [facility] [place], [place]  . TUBAL LIGATION  09/08/2011   Procedure: POST PARTUM TUBAL LIGATION;  Surgeon: Delice Lesch, MD;  Location: [facility];  Service: Gynecology;  Laterality: Bilateral;  . WISDOM TOOTH EXTRACTION      Social History   Social History  . Marital status: Single    Spouse name: N/A  . Number of children: N/A  . Years of education: N/A   Occupational History  . Not on file.   Social History Main Topics  . Smoking status: Never Smoker  . Smokeless tobacco: Never Used  . Alcohol use No  . Drug use: No  . Sexual activity: Not Currently    Birth control/ protection: Surgical     Comment: BTL   Other Topics Concern  . 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  Exercise - 3-4 days per week, eating healthy as of 10/2014    Family History  Problem Relation Age of Onset  . Seizures Father   . Cancer Father     liver  . Cirrhosis Father   . Alcohol abuse Father   . Hypertension  Maternal Grandmother   . Diabetes Maternal Grandmother   . Hyperthyroidism Maternal Grandmother   . Hypertension Maternal Grandfather   . Cancer Maternal Grandfather     ?type  . Anesthesia problems Neg Hx   . Heart disease Neg Hx      Current Outpatient Prescriptions:  .  iron polysaccharides (NU-IRON) 150 MG capsule, Take 1 capsule (150 mg total) by mouth 2 (two) times daily., Disp: 60 capsule, Rfl: 3 .  Multiple Vitamin (MULTIVITAMIN) tablet, Take 1 tablet by mouth daily., Disp: , Rfl:   No Known Allergies   ROS as in subjective otherwise  Objective: BP 102/80 (BP Location: Right Arm, Patient Position: Sitting, Cuff Size: Large)   Pulse 84   Temp 98.1 F (36.7 C) (Oral)   Ht 5' 5.5" (1.664 m)   Wt 216 lb (98 kg)   LMP 11/18/2015   SpO2 98%   BMI 35.40 kg/m   Wt Readings from Last 3 Encounters:  11/26/15 216 lb (98 kg)  11/19/14 252 lb 6.4 oz (114.5 kg)  08/15/14 236 lb (107 kg)   General appearance: alert, no distress, WD/WN, obese AA female Skin: multiple tattoos, left chest, mid and bilat upper back, forearms, tattoo of  [place] on right outer upper arm, no worrisome lesions HEENT: normocephalic, conjunctiva/corneas normal, sclerae anicteric, PERRLA, EOMi, nares patent, no discharge or erythema, pharynx normal Oral cavity: MMM, tongue normal, teeth normal Neck: supple, no lymphadenopathy, no thyromegaly, no masses, normal ROM, no bruits Chest: non tender, normal shape and expansion Heart: RRR, normal S1, S2, no murmurs Lungs: CTA bilaterally, no wheezes, rhonchi, or rales Abdomen: +bs, soft, surgical ports scars of left and right abdomen, non tender, non distended, no masses, no hepatomegaly, no splenomegaly, no bruits Back: non tender, normal ROM, no scoliosis Musculoskeletal: upper extremities non tender, no obvious deformity, normal ROM throughout, lower extremities non tender, no obvious deformity, normal ROM throughout Extremities: no edema, no cyanosis, no  clubbing Pulses: 2+ symmetric, upper and lower extremities, normal cap refill Neurological: alert, oriented x 3, CN2-12 intact, strength normal upper extremities and lower extremities, sensation normal throughout, DTRs 2+ throughout, no cerebellar signs, gait normal Psychiatric: normal affect, behavior normal, pleasant  Breast/gyn/rectal - deferred to gyn   Assessment: Encounter Diagnoses  Name Primary?  . Healthcare maintenance Yes  . Need for prophylactic vaccination and inoculation against influenza   . Impaired fasting blood sugar   . Family history of thyroid disease   . Anemia, iron deficiency   . History of gestational hypertension   . Morbid obesity, unspecified obesity type ([place])   . History of gastric bypass     Plan: Physical exam - discussed healthy lifestyle, diet, exercise, preventative care, vaccinations, and addressed their concerns.   Advised they see a dentist yearly for routine dental care including hygiene visits twice yearly. See your gynecologist yearly for routine gynecological care.  Labs through her employer [facility].  I wrote script order for this  Vaccinations: Counseled on the influenza virus vaccine.  Vaccine information sheet given.  Influenza vaccine given after consent obtained.  Follow up pending labs

## 2016-12-15 ENCOUNTER — Encounter: Payer: Self-pay | Admitting: Medical

## 2016-12-15 ENCOUNTER — Ambulatory Visit (INDEPENDENT_AMBULATORY_CARE_PROVIDER_SITE_OTHER): Payer: 59 | Admitting: Medical

## 2016-12-15 VITALS — BP 122/80 | HR 66 | Ht 66.0 in | Wt 198.6 lb

## 2016-12-15 DIAGNOSIS — D509 Iron deficiency anemia, unspecified: Secondary | ICD-10-CM

## 2016-12-15 DIAGNOSIS — Z8349 Family history of other endocrine, nutritional and metabolic diseases: Secondary | ICD-10-CM | POA: Diagnosis not present

## 2016-12-15 DIAGNOSIS — Z0000 Encounter for general adult medical examination without abnormal findings: Secondary | ICD-10-CM

## 2016-12-15 DIAGNOSIS — R7301 Impaired fasting glucose: Secondary | ICD-10-CM

## 2016-12-15 DIAGNOSIS — Z23 Encounter for immunization: Secondary | ICD-10-CM

## 2016-12-15 DIAGNOSIS — Z9884 Bariatric surgery status: Secondary | ICD-10-CM | POA: Diagnosis not present

## 2016-12-15 NOTE — Progress Notes (Signed)
Subjective: Chief Complaint  Patient presents with  . Annual Exam    physical, no concerns    Here for physical   Gyn - sees gynecology, Dr. Leo Grosser, up to date on pap, hx/o tubal ligation Tysinger, Connie Eng, PA-C here for primary care Dentist  She notes weight hovers between 190-199lb.   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   Doing fine currently.  Eating healthy, exercising.  Has f/u with surgeon for 5 years s/p.     No other c/o.    She is compliant with iron once daily, bariatric vitamin daily   Past Medical History:  Diagnosis Date  . Anemia   . Chlamydia 4 years ago  . Hypertension    HTN during pregnancy  . Obesity   . Urinary tract infection     Past Surgical History:  Procedure Laterality Date  . CHOLECYSTECTOMY  01/2015   during gastric sleeve surgery  . GASTRIC BYPASS  02/25/2016   sleeve procedure, [facility], [place], [place]  . TUBAL LIGATION  09/08/2011   Procedure: POST PARTUM TUBAL LIGATION;  Surgeon: Delice Lesch, MD;  Location: [facility];  Service: Gynecology;  Laterality: Bilateral;  . WISDOM TOOTH EXTRACTION      Social History   Social History  . Marital status: Single    Spouse name: N/A  . Number of children: N/A  . Years of education: N/A   Occupational History  . Not on file.   Social History Main Topics  . Smoking status: Never Smoker  . Smokeless tobacco: Never Used  . Alcohol use No  . Drug use: No  . Sexual activity: Not Currently    Birth control/ protection: Surgical     Comment: BTL   Other Topics Concern  . Not on file   Social History Narrative   Has 3 children.   Married.  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  Exercise - 3-4 days per week, Zumba, walking, eating healthy as of 11/2016    Family History  Problem Relation Age of Onset  . Seizures Father   . Cancer Father        liver  . Cirrhosis Father   . Alcohol abuse Father   .  Hypertension Maternal Grandmother   . Diabetes Maternal Grandmother   . Hyperthyroidism Maternal Grandmother   . Hypertension Maternal Grandfather   . Cancer Maternal Grandfather        ?type  . Anesthesia problems Neg Hx   . Heart disease Neg Hx      Current Outpatient Prescriptions:  .  iron polysaccharides (NU-IRON) 150 MG capsule, Take 1 capsule (150 mg total) by mouth 2 (two) times daily., Disp: 60 capsule, Rfl: 3 .  Multiple Vitamin (MULTIVITAMIN) tablet, Take 1 tablet by mouth daily., Disp: , Rfl:   No Known Allergies   ROS as in subjective otherwise     Objective: BP 122/80   Pulse 66   Ht 5\' 6"  (1.676 m)   Wt 198 lb 9.6 oz (90.1 kg)   LMP 12/07/2016   SpO2 99%   BMI 32.05 kg/m   Wt Readings from Last 3 Encounters:  12/15/16 198 lb 9.6 oz (90.1 kg)  11/26/15 216 lb (98 kg)  11/19/14 252 lb 6.4 oz (114.5 kg)   General appearance: alert, no distress, WD/WN, obese AA female Skin: multiple tattoos, left chest, mid and bilat upper back, forearms, tattoo of Anisa on  right outer upper arm, pineapple tattoo right dorsal foot, no worrisome lesions HEENT: normocephalic, conjunctiva/corneas normal, sclerae anicteric, PERRLA, EOMi, nares patent, no discharge or erythema, pharynx normal Oral cavity: MMM, tongue normal, teeth normal Neck: supple, no lymphadenopathy, no thyromegaly, no masses, normal ROM, no bruits Chest: non tender, normal shape and expansion Heart: RRR, normal S1, S2, no murmurs Lungs: CTA bilaterally, no wheezes, rhonchi, or rales Abdomen: +bs, soft, surgical ports scars of left and right abdomen, non tender, non distended, no masses, no hepatomegaly, no splenomegaly, no bruits Back: non tender, normal ROM, no scoliosis Musculoskeletal: upper extremities non tender, no obvious deformity, normal ROM throughout, lower extremities non tender, no obvious deformity, normal ROM throughout Extremities: no edema, no cyanosis, no clubbing Pulses: 2+ symmetric,  upper and lower extremities, normal cap refill Neurological: alert, oriented x 3, CN2-12 intact, strength normal upper extremities and lower extremities, sensation normal throughout, DTRs 2+ throughout, no cerebellar signs, gait normal Psychiatric: normal affect, behavior normal, pleasant  Breast/gyn/rectal - deferred to gyn     Assessment: Encounter Diagnoses  Name Primary?  . Encounter for health maintenance examination in adult Yes  . Need for immunization against influenza   . Impaired fasting blood sugar   . Iron deficiency anemia, unspecified iron deficiency anemia type   . History of gastric bypass   . Family history of thyroid disease   . Need for prophylactic vaccination and inoculation against influenza     Plan: Physical exam - discussed healthy lifestyle, diet, exercise, preventative care, vaccinations, and addressed their concerns.   Advised they see a dentist yearly for routine dental care including hygiene visits twice yearly. See your gynecologist yearly for routine gynecological care.  Labs through her employer [facility].  I wrote script order for this  Counseled on doing longer moderate intensity exercise instead of briefer exercise, consider Weight Watches program, cutting out 500 calories /day from diet  Vaccinations:  Counseled on the influenza virus vaccine.  Vaccine information sheet given.  Influenza vaccine given after consent obtained.  Follow up pending labs  Connie Taylor was seen today for annual exam.  Diagnoses and all orders for this visit:  Encounter for health maintenance examination in adult -     Comprehensive metabolic panel; Future -     Lipid panel; Future -     Hemoglobin A1c; Future -     CBC; Future -     Iron, TIBC and Ferritin Panel; Future  Need for immunization against influenza -     Flu Vaccine QUAD 36+ mos IM  Impaired fasting blood sugar -     Hemoglobin A1c; Future  Iron deficiency anemia, unspecified iron deficiency anemia  type -     CBC; Future -     Iron, TIBC and Ferritin Panel; Future  History of gastric bypass  Family history of thyroid disease  Need for prophylactic vaccination and inoculation against influenza

## 2016-12-20 DIAGNOSIS — Z23 Encounter for immunization: Secondary | ICD-10-CM

## 2017-03-22 ENCOUNTER — Telehealth: Payer: Self-pay | Admitting: Family Medicine

## 2017-03-22 ENCOUNTER — Encounter: Payer: Self-pay | Admitting: Family Medicine

## 2017-03-22 ENCOUNTER — Telehealth: Payer: Self-pay | Admitting: Medical

## 2017-03-22 NOTE — Telephone Encounter (Signed)
Received requested records from [facility]. Sending back for review.

## 2017-03-22 NOTE — Telephone Encounter (Signed)
Connie Taylor with [facility] called from [phone] and left message that we needed to get copy of ekg and stress test from [place] [phone].  I called Gwynneth Macleod talked with Symprhona and she stated she could not give [place] the reports, that she could give the report to [facility] since they ordered them.  Called Connie Taylor back at [facility] and tried to explain same to her, that Gwynneth Macleod would give her the test results and she hung up the phone.  Sent letter to [facility] management requesting same.

## 2017-03-24 ENCOUNTER — Encounter: Payer: Self-pay | Admitting: Medical

## 2017-03-31 ENCOUNTER — Encounter: Payer: Self-pay | Admitting: Medical

## 2018-08-28 ENCOUNTER — Encounter: Payer: Self-pay | Admitting: Medical

## 2018-10-25 MED FILL — AMOXICILLIN 500 MG CAPSULE: 500 | 7 days supply | Qty: 21 | Fill #0

## 2018-10-25 MED FILL — IBUPROFEN 600 MG TABLET: 600 | 7 days supply | Qty: 30 | Fill #0

## 2019-06-08 ENCOUNTER — Ambulatory Visit: Payer: Self-pay | Attending: Internal Medicine

## 2019-06-08 DIAGNOSIS — Z23 Encounter for immunization: Secondary | ICD-10-CM

## 2019-06-08 NOTE — Progress Notes (Signed)
   Covid-19 Vaccination Clinic  Name:  Connie Taylor    MRN: [id number] DOB: Jun 04, 1985  06/08/2019  [name] was observed post Covid-19 immunization for 15 minutes without incident. She was provided with Vaccine Information Sheet and instruction to access the V-Safe system.   [name] was instructed to call 911 with any severe reactions post vaccine: Marland Kitchen Difficulty breathing  . Swelling of face and throat  . A fast heartbeat  . A bad rash all over body  . Dizziness and weakness   Immunizations Administered    Name Date Dose VIS Date Route   Pfizer COVID-19 Vaccine 06/08/2019  4:28 PM 0.3 mL 02/09/2019 Intramuscular   Manufacturer: [place]   Lot: [id number]   [place]: [id number]

## 2019-07-02 ENCOUNTER — Ambulatory Visit: Payer: Self-pay | Attending: Internal Medicine

## 2019-07-02 DIAGNOSIS — Z23 Encounter for immunization: Secondary | ICD-10-CM

## 2019-07-02 NOTE — Progress Notes (Signed)
   Covid-19 Vaccination Clinic  Name:  Connie Taylor    MRN: [id number] DOB: April 18, 1985  07/02/2019  Connie Taylor was observed post Covid-19 immunization for 15 minutes without incident. She was provided with Vaccine Information Sheet and instruction to access the V-Safe system.   Connie Taylor was instructed to call 911 with any severe reactions post vaccine: Marland Kitchen Difficulty breathing  . Swelling of face and throat  . A fast heartbeat  . A bad rash all over body  . Dizziness and weakness   Immunizations Administered    Name Date Dose VIS Date Route   Pfizer COVID-19 Vaccine 07/02/2019  4:30 PM 0.3 mL 04/25/2018 Intramuscular   Manufacturer: [place]   Lot: [id number]   [place]: [id number]

## 2021-04-10 ENCOUNTER — Encounter: Payer: Self-pay | Admitting: Medical

## 2021-04-10 ENCOUNTER — Other Ambulatory Visit: Payer: Self-pay

## 2021-04-10 ENCOUNTER — Ambulatory Visit (INDEPENDENT_AMBULATORY_CARE_PROVIDER_SITE_OTHER): Payer: BC Managed Care – PPO | Admitting: Medical

## 2021-04-10 VITALS — BP 120/80 | HR 73 | Ht 66.5 in | Wt 246.8 lb

## 2021-04-10 DIAGNOSIS — Z8349 Family history of other endocrine, nutritional and metabolic diseases: Secondary | ICD-10-CM

## 2021-04-10 DIAGNOSIS — Z23 Encounter for immunization: Secondary | ICD-10-CM

## 2021-04-10 DIAGNOSIS — Z0000 Encounter for general adult medical examination without abnormal findings: Secondary | ICD-10-CM | POA: Diagnosis not present

## 2021-04-10 DIAGNOSIS — Z1329 Encounter for screening for other suspected endocrine disorder: Secondary | ICD-10-CM

## 2021-04-10 DIAGNOSIS — Z131 Encounter for screening for diabetes mellitus: Secondary | ICD-10-CM | POA: Diagnosis not present

## 2021-04-10 DIAGNOSIS — Z13220 Encounter for screening for lipoid disorders: Secondary | ICD-10-CM | POA: Diagnosis not present

## 2021-04-10 DIAGNOSIS — Z6839 Body mass index (BMI) 39.0-39.9, adult: Secondary | ICD-10-CM | POA: Diagnosis not present

## 2021-04-10 DIAGNOSIS — D509 Iron deficiency anemia, unspecified: Secondary | ICD-10-CM | POA: Diagnosis not present

## 2021-04-10 NOTE — Addendum Note (Signed)
Addended by: Minette Headland A on: 04/10/2021 02:06 PM   Modules accepted: Orders

## 2021-04-10 NOTE — Patient Instructions (Signed)
This visit was a preventative care visit, also known as wellness visit or routine physical.   Topics typically include healthy lifestyle, diet, exercise, preventative care, vaccinations, sick and well care, proper use of emergency dept and after hours care, as well as other concerns.     Recommendations: Continue to return yearly for your annual wellness and preventative care visits.  This gives [place] a chance to discuss healthy lifestyle, exercise, vaccinations, review your chart record, and perform screenings where appropriate.  I recommend you see your eye doctor yearly for routine vision care.  I recommend you see your dentist yearly for routine dental care including hygiene visits twice yearly.  See your gynecologist yearly for routine gynecological care.   Vaccination recommendations were reviewed Immunization History  Administered Date(s) Administered   Influenza,inj,Quad PF,6+ Mos 01/26/2015, 11/26/2015, 12/20/2016   Influenza,inj,quad, With Preservative 11/26/2015   Influenza-Unspecified 01/28/2021   PFIZER(Purple Top)SARS-COV-2 Vaccination 06/08/2019, 07/02/2019   Tdap 09/08/2011   Counseled on the Td (tetanus, diptheria) vaccine.  Vaccine information sheet given. Td vaccine given after consent obtained.    Screening for cancer: Colon cancer screening: Age 6  Breast cancer screening: You should perform a self breast exam monthly.   We reviewed recommendations for regular mammograms and breast cancer screening.  Cervical cancer screening: We reviewed recommendations for pap smear screening.   Skin cancer screening: Check your skin regularly for new changes, growing lesions, or other lesions of concern Come in for evaluation if you have skin lesions of concern.  Lung cancer screening: If you have a greater than 20 pack year history of tobacco use, then you may qualify for lung cancer screening with a chest CT scan.   Please call your insurance company to inquire  about coverage for this test.  We currently don't have screenings for other cancers besides breast, cervical, colon, and lung cancers.  If you have a strong family history of cancer or have other cancer screening concerns, please let me know.    Bone health: Get at least 150 minutes of aerobic exercise weekly Get weight bearing exercise at least once weekly Bone density test:  A bone density test is an imaging test that uses a type of X-ray to measure the amount of calcium and other minerals in your bones. The test may be used to diagnose or screen you for a condition that causes weak or thin bones (osteoporosis), predict your risk for a broken bone (fracture), or determine how well your osteoporosis treatment is working. The bone density test is recommended for females 38 and older, or females or males <90 if certain risk factors such as thyroid disease, long term use of steroids such as for asthma or rheumatological issues, vitamin D deficiency, estrogen deficiency, family history of osteoporosis, self or family history of fragility fracture in first degree relative.    Heart health: Get at least 150 minutes of aerobic exercise weekly Limit alcohol It is important to maintain a healthy blood pressure and healthy cholesterol numbers  Heart disease screening: Screening for heart disease includes screening for blood pressure, fasting lipids, glucose/diabetes screening, BMI height to weight ratio, reviewed of smoking status, physical activity, and diet.    Goals include blood pressure 120/80 or less, maintaining a healthy lipid/cholesterol profile, preventing diabetes or keeping diabetes numbers under good control, not smoking or using tobacco products, exercising most days per week or at least 150 minutes per week of exercise, and eating healthy variety of fruits and vegetables, healthy oils, and avoiding  unhealthy food choices like fried food, fast food, high sugar and high cholesterol foods.     Other tests may possibly include EKG test, CT coronary calcium score, echocardiogram, exercise treadmill stress test.    Medical care options: I recommend you continue to seek care here first for routine care.  We try really hard to have available appointments Monday through Friday daytime hours for sick visits, acute visits, and physicals.  Urgent care should be used for after hours and weekends for significant issues that cannot wait till the next day.  The emergency department should be used for significant potentially life-threatening emergencies.  The emergency department is expensive, can often have long wait times for less significant concerns, so try to utilize primary care, urgent care, or telemedicine when possible to avoid unnecessary trips to the emergency department.  Virtual visits and telemedicine have been introduced since the pandemic started in 2020, and can be convenient ways to receive medical care.  We offer virtual appointments as well to assist you in a variety of options to seek medical care.    Separate significant issues discussed: BMI > 30 - work on efforts to lose weight through healthy diet an exercise

## 2021-04-10 NOTE — Progress Notes (Signed)
Subjective:   HPI  Connie Taylor is a 36 y.o. female who presents for Chief Complaint  Patient presents with   fasting cpe    Fasting cpe, no concerns- sees obgyn- [place]    Patient Care Team: Liona Wengert, Leward Quan as PCP - General (Family Medicine) Sees dentist, [facility] Sees eye doctor [facility]   Concerns: Doing fine, no issues.   Fasting for labs.  Reviewed their medical, surgical, family, social, medication, and allergy history and updated chart as appropriate.  Past Medical History:  Diagnosis Date   Anemia    Chlamydia 4 years ago   Hypertension    HTN during pregnancy   Obesity    Urinary tract infection     Family History  Problem Relation Age of Onset   Seizures Father    Cancer Father        liver   Cirrhosis Father    Alcohol abuse Father    Hypertension Maternal Grandmother    Diabetes Maternal Grandmother    Hyperthyroidism Maternal Grandmother    Hypertension Maternal Grandfather    Cancer Maternal Grandfather        ?type   Anesthesia problems Neg Hx    Heart disease Neg Hx    Past Surgical History:  Procedure Laterality Date   CHOLECYSTECTOMY  01/2015   during gastric sleeve surgery   GASTRIC BYPASS  02/25/2016   sleeve procedure, [facility] [place], [place], [place]   TUBAL LIGATION  09/08/2011   Procedure: POST PARTUM TUBAL LIGATION;  Surgeon: Delice Lesch, MD;  Location: [facility];  Service: Gynecology;  Laterality: Bilateral;   WISDOM TOOTH EXTRACTION       Current Outpatient Medications:    Biotin 5000 MCG CAPS, , Disp: , Rfl:    iron polysaccharides (NU-IRON) 150 MG capsule, Take 1 capsule (150 mg total) by mouth 2 (two) times daily., Disp: 60 capsule, Rfl: 3   Multiple Vitamin (MULTIVITAMIN) tablet, Take 1 tablet by mouth daily., Disp: , Rfl:   No Known Allergies     Review of Systems Constitutional: -fever, -chills, -sweats, -unexpected weight change, -decreased appetite,  -fatigue Allergy: -sneezing, -itching, -congestion Dermatology: -changing moles, --rash, -lumps ENT: -runny nose, -ear pain, -sore throat, -hoarseness, -sinus pain, -teeth pain, - ringing in ears, -hearing loss, -nosebleeds Cardiology: -chest pain, -palpitations, -swelling, -difficulty breathing when lying flat, -waking up short of breath Respiratory: -cough, -shortness of breath, -difficulty breathing with exercise or exertion, -wheezing, -coughing up blood Gastroenterology: -abdominal pain, -nausea, -vomiting, -diarrhea, -constipation, -blood in stool, -changes in bowel movement, -difficulty swallowing or eating Hematology: -bleeding, -bruising  Musculoskeletal: -joint aches, -muscle aches, -joint swelling, -back pain, -neck pain, -cramping, -changes in gait Ophthalmology: denies vision changes, eye redness, itching, discharge Urology: -burning with urination, -difficulty urinating, -blood in urine, -urinary frequency, -urgency, -incontinence Neurology: -headache, -weakness, -tingling, -numbness, -memory loss, -falls, -dizziness Psychology: -depressed mood, -agitation, -sleep problems Breast/gyn: -breast tendnerss, -discharge, -lumps, -vaginal discharge,- irregular periods, -heavy periods   Depression screen [facility] 2/9 04/10/2021 12/15/2016 11/26/2015 09/26/2013  Decreased Interest 0 0 0 0  Down, Depressed, Hopeless 0 0 0 0  PHQ - 2 Score 0 0 0 0  Altered sleeping - - 0 -  Tired, decreased energy - - 0 -  Change in appetite - - 0 -  Feeling bad or failure about yourself  - - 0 -  Trouble concentrating - - 0 -  Moving slowly or fidgety/restless - - 0 -  Suicidal  thoughts - - 0 -  PHQ-9 Score - - 0 -       Objective:  BP 120/80    Pulse 73    Ht 5' 6.5" (1.689 m)    Wt 246 lb 12.8 oz (111.9 kg)    LMP 03/21/2021    BMI 39.24 kg/m   General appearance: alert, no distress, WD/WN, African American female Skin: unremarkable HEENT: normocephalic, conjunctiva/corneas normal, sclerae  anicteric, PERRLA, EOMi, nares patent, no discharge or erythema, pharynx normal Oral cavity: MMM, tongue normal, teeth normal Neck: supple, no lymphadenopathy, no thyromegaly, no masses, normal ROM, no bruits Chest: non tender, normal shape and expansion Heart: RRR, normal S1, S2, no murmurs Lungs: CTA bilaterally, no wheezes, rhonchi, or rales Abdomen: +bs, soft, non tender, non distended, no masses, no hepatomegaly, no splenomegaly, no bruits Back: non tender, normal ROM, no scoliosis Musculoskeletal: upper extremities non tender, no obvious deformity, normal ROM throughout, lower extremities non tender, no obvious deformity, normal ROM throughout Extremities: no edema, no cyanosis, no clubbing Pulses: 2+ symmetric, upper and lower extremities, normal cap refill Neurological: alert, oriented x 3, CN2-12 intact, strength normal upper extremities and lower extremities, sensation normal throughout, DTRs 2+ throughout, no cerebellar signs, gait normal Psychiatric: normal affect, behavior normal, pleasant  Breast/gyn/rectal - deferred to gynecology    Assessment and Plan :   Encounter Diagnoses  Name Primary?   Encounter for health maintenance examination in adult Yes   BMI 39.0-39.9,adult    Need for Td vaccine    Screening for diabetes mellitus    Screening for lipid disorders    Screening for thyroid disorder    Family history of thyroid disease      This visit was a preventative care visit, also known as wellness visit or routine physical.   Topics typically include healthy lifestyle, diet, exercise, preventative care, vaccinations, sick and well care, proper use of emergency dept and after hours care, as well as other concerns.     Recommendations: Continue to return yearly for your annual wellness and preventative care visits.  This gives [place] a chance to discuss healthy lifestyle, exercise, vaccinations, review your chart record, and perform screenings where appropriate.  I  recommend you see your eye doctor yearly for routine vision care.  I recommend you see your dentist yearly for routine dental care including hygiene visits twice yearly.  See your gynecologist yearly for routine gynecological care.   Vaccination recommendations were reviewed Immunization History  Administered Date(s) Administered   Influenza,inj,Quad PF,6+ Mos 01/26/2015, 11/26/2015, 12/20/2016   Influenza,inj,quad, With Preservative 11/26/2015   Influenza-Unspecified 01/28/2021   PFIZER(Purple Top)SARS-COV-2 Vaccination 06/08/2019, 07/02/2019   Tdap 09/08/2011   Counseled on the Td (tetanus, diptheria) vaccine.  Vaccine information sheet given. Td vaccine given after consent obtained.    Screening for cancer: Colon cancer screening: Age 84  Breast cancer screening: You should perform a self breast exam monthly.   We reviewed recommendations for regular mammograms and breast cancer screening.  Cervical cancer screening: We reviewed recommendations for pap smear screening.   Skin cancer screening: Check your skin regularly for new changes, growing lesions, or other lesions of concern Come in for evaluation if you have skin lesions of concern.  Lung cancer screening: If you have a greater than 20 pack year history of tobacco use, then you may qualify for lung cancer screening with a chest CT scan.   Please call your insurance company to inquire about coverage for this test.  We currently  don't have screenings for other cancers besides breast, cervical, colon, and lung cancers.  If you have a strong family history of cancer or have other cancer screening concerns, please let me know.    Bone health: Get at least 150 minutes of aerobic exercise weekly Get weight bearing exercise at least once weekly Bone density test:  A bone density test is an imaging test that uses a type of X-ray to measure the amount of calcium and other minerals in your bones. The test may be used to  diagnose or screen you for a condition that causes weak or thin bones (osteoporosis), predict your risk for a broken bone (fracture), or determine how well your osteoporosis treatment is working. The bone density test is recommended for females 105 and older, or females or males <29 if certain risk factors such as thyroid disease, long term use of steroids such as for asthma or rheumatological issues, vitamin D deficiency, estrogen deficiency, family history of osteoporosis, self or family history of fragility fracture in first degree relative.    Heart health: Get at least 150 minutes of aerobic exercise weekly Limit alcohol It is important to maintain a healthy blood pressure and healthy cholesterol numbers  Heart disease screening: Screening for heart disease includes screening for blood pressure, fasting lipids, glucose/diabetes screening, BMI height to weight ratio, reviewed of smoking status, physical activity, and diet.    Goals include blood pressure 120/80 or less, maintaining a healthy lipid/cholesterol profile, preventing diabetes or keeping diabetes numbers under good control, not smoking or using tobacco products, exercising most days per week or at least 150 minutes per week of exercise, and eating healthy variety of fruits and vegetables, healthy oils, and avoiding unhealthy food choices like fried food, fast food, high sugar and high cholesterol foods.    Other tests may possibly include EKG test, CT coronary calcium score, echocardiogram, exercise treadmill stress test.    Medical care options: I recommend you continue to seek care here first for routine care.  We try really hard to have available appointments Monday through Friday daytime hours for sick visits, acute visits, and physicals.  Urgent care should be used for after hours and weekends for significant issues that cannot wait till the next day.  The emergency department should be used for significant potentially  life-threatening emergencies.  The emergency department is expensive, can often have long wait times for less significant concerns, so try to utilize primary care, urgent care, or telemedicine when possible to avoid unnecessary trips to the emergency department.  Virtual visits and telemedicine have been introduced since the pandemic started in 2020, and can be convenient ways to receive medical care.  We offer virtual appointments as well to assist you in a variety of options to seek medical care.    Separate significant issues discussed: BMI > 30 - work on efforts to lose weight through healthy diet an exercise     Findley was seen today for fasting cpe.  Diagnoses and all orders for this visit:  Encounter for health maintenance examination in adult -     Comprehensive metabolic panel -     CBC -     Lipid panel -     TSH -     Hemoglobin A1c -     Iron, TIBC and Ferritin Panel  BMI 39.0-39.9,adult -     TSH -     Hemoglobin A1c  Need for Td vaccine  Screening for diabetes mellitus -  Hemoglobin A1c  Screening for lipid disorders -     Lipid panel  Screening for thyroid disorder -     TSH  Family history of thyroid disease -     TSH    Follow-up pending labs, yearly for physical

## 2021-04-11 LAB — COMPREHENSIVE METABOLIC PANEL
ALT: 6 IU/L (ref 0–32)
AST: 14 IU/L (ref 0–40)
Albumin/Globulin Ratio: 1.7 (ref 1.2–2.2)
Albumin: 4.5 g/dL (ref 3.8–4.8)
Alkaline Phosphatase: 74 IU/L (ref 44–121)
BUN/Creatinine Ratio: 14 (ref 9–23)
BUN: 10 mg/dL (ref 6–20)
Bilirubin Total: 0.4 mg/dL (ref 0.0–1.2)
CO2: 21 mmol/L (ref 20–29)
Calcium: 9.3 mg/dL (ref 8.7–10.2)
Chloride: 104 mmol/L (ref 96–106)
Creatinine, Ser: 0.74 mg/dL (ref 0.57–1.00)
Globulin, Total: 2.7 g/dL (ref 1.5–4.5)
Glucose: 76 mg/dL (ref 70–99)
Potassium: 4.3 mmol/L (ref 3.5–5.2)
Sodium: 140 mmol/L (ref 134–144)
Total Protein: 7.2 g/dL (ref 6.0–8.5)
eGFR: 109 mL/min/{1.73_m2} (ref >59)

## 2021-04-11 LAB — LIPID PANEL
Chol/HDL Ratio: 3.2 ratio (ref 0.0–4.4)
Cholesterol, Total: 166 mg/dL (ref 100–199)
HDL: 52 mg/dL (ref >39)
LDL Chol Calc (NIH): 99 mg/dL (ref 0–99)
Triglycerides: 82 mg/dL (ref 0–149)
VLDL Cholesterol Cal: 15 mg/dL (ref 5–40)

## 2021-04-11 LAB — CBC
Hematocrit: 27.9 % — ABNORMAL LOW (ref 34.0–46.6)
Hemoglobin: 7.5 g/dL — ABNORMAL LOW (ref 11.1–15.9)
MCH: 16.1 pg — ABNORMAL LOW (ref 26.6–33.0)
MCHC: 26.9 g/dL — ABNORMAL LOW (ref 31.5–35.7)
MCV: 60 fL — ABNORMAL LOW (ref 79–97)
Platelets: 483 10*3/uL — ABNORMAL HIGH (ref 150–450)
RBC: 4.66 x10E6/uL (ref 3.77–5.28)
RDW: 19.7 % — ABNORMAL HIGH (ref 11.7–15.4)
WBC: 6.6 10*3/uL (ref 3.4–10.8)

## 2021-04-11 LAB — IRON,TIBC AND FERRITIN PANEL
Ferritin: 6 ng/mL — ABNORMAL LOW (ref 15–150)
Iron Saturation: 37 % (ref 15–55)
Iron: 152 ug/dL (ref 27–159)
Total Iron Binding Capacity: 406 ug/dL (ref 250–450)
UIBC: 254 ug/dL (ref 131–425)

## 2021-04-11 LAB — URINALYSIS
Bilirubin, UA: NEGATIVE
Glucose, UA: NEGATIVE
Ketones, UA: NEGATIVE
Leukocytes,UA: NEGATIVE
Nitrite, UA: NEGATIVE
RBC, UA: NEGATIVE
Specific Gravity, UA: >=1.03 — AB (ref 1.005–1.030)
Urobilinogen, Ur: 1 mg/dL (ref 0.2–1.0)
pH, UA: 5 (ref 5.0–7.5)

## 2021-04-11 LAB — HEMOGLOBIN A1C
Est. average glucose Bld gHb Est-mCnc: 126 mg/dL
Hgb A1c MFr Bld: 6 % — ABNORMAL HIGH (ref 4.8–5.6)

## 2021-04-11 LAB — TSH: TSH: 1.46 u[IU]/mL (ref 0.450–4.500)

## 2021-05-05 DIAGNOSIS — Z124 Encounter for screening for malignant neoplasm of cervix: Secondary | ICD-10-CM | POA: Diagnosis not present

## 2021-05-05 DIAGNOSIS — Z01411 Encounter for gynecological examination (general) (routine) with abnormal findings: Secondary | ICD-10-CM | POA: Diagnosis not present

## 2021-05-05 DIAGNOSIS — Z6841 Body Mass Index (BMI) 40.0 and over, adult: Secondary | ICD-10-CM | POA: Diagnosis not present

## 2021-05-05 DIAGNOSIS — N920 Excessive and frequent menstruation with regular cycle: Secondary | ICD-10-CM | POA: Diagnosis not present

## 2021-05-05 DIAGNOSIS — D649 Anemia, unspecified: Secondary | ICD-10-CM | POA: Diagnosis not present

## 2021-05-05 LAB — HM PAP SMEAR: HM Pap smear: NEGATIVE

## 2021-05-05 LAB — RESULTS CONSOLE HPV: CHL HPV: NEGATIVE

## 2021-05-14 ENCOUNTER — Encounter: Payer: Self-pay | Admitting: Internal Medicine

## 2021-05-15 ENCOUNTER — Encounter: Payer: Self-pay | Admitting: Medical

## 2021-05-20 DIAGNOSIS — N920 Excessive and frequent menstruation with regular cycle: Secondary | ICD-10-CM | POA: Diagnosis not present

## 2021-05-20 DIAGNOSIS — D251 Intramural leiomyoma of uterus: Secondary | ICD-10-CM | POA: Diagnosis not present

## 2021-09-18 ENCOUNTER — Ambulatory Visit (INDEPENDENT_AMBULATORY_CARE_PROVIDER_SITE_OTHER): Payer: BC Managed Care – PPO | Admitting: Medical

## 2021-09-18 VITALS — BP 120/78 | HR 74 | Wt 253.8 lb

## 2021-09-18 DIAGNOSIS — Z6841 Body Mass Index (BMI) 40.0 and over, adult: Secondary | ICD-10-CM | POA: Diagnosis not present

## 2021-09-18 DIAGNOSIS — R1310 Dysphagia, unspecified: Secondary | ICD-10-CM

## 2021-09-18 DIAGNOSIS — D259 Leiomyoma of uterus, unspecified: Secondary | ICD-10-CM

## 2021-09-18 DIAGNOSIS — D509 Iron deficiency anemia, unspecified: Secondary | ICD-10-CM | POA: Diagnosis not present

## 2021-09-18 DIAGNOSIS — K219 Gastro-esophageal reflux disease without esophagitis: Secondary | ICD-10-CM | POA: Diagnosis not present

## 2021-09-18 DIAGNOSIS — Z9884 Bariatric surgery status: Secondary | ICD-10-CM

## 2021-09-18 MED ORDER — PANTOPRAZOLE SODIUM 40 MG PO TBEC: DELAYED_RELEASE_TABLET | ORAL | 1 refills | Status: DC

## 2021-09-18 NOTE — Progress Notes (Signed)
Subjective:  Connie Taylor is a 36 y.o. female who presents for Chief Complaint  Patient presents with   Gastroesophageal Reflux    Acid reflux getting worse over the last month or 2. Taking prilosec and this week started throwing up. She has had a sleeve and wants to make sure nothing is going on with it,making her food come back up. Would like Iron checked.  Sometimes when taking capsules or tablets it will get stuck in throat.     Here for acid reflux concerns.  For the night her acid reflux woke her out of sleep, felt like she was going to vomit.  Is been worse the last 2 months.  She does have a history of gastric sleeve surgery.  She is currently using over-the-counter Prilosec and Tums.  She does not eat spicy or acidic foods.  She tries to use eating habits that would reduce acid reflux in general.  No prior GI consult.  No alcohol or very limited alcohol use.  She also notes in recent weeks some trouble swallowing.  She has the either ground meat or not really tolerating even bites of chicken.  She does not necessarily vomit food up but she does not like she is having trouble swallowing foods.  At her last visit in February 2023 here she was significantly anemic.  We referred to gynecology.  Saw gynecology after 2/23 visit here.   Found out she had 2 fibroids.   Is s/p Tubal ligation.  She is using her iron daily.  Wants to recheck her blood counts and iron today.  No other aggravating or relieving factors.    No other c/o.  Past Medical History:  Diagnosis Date   Anemia    Chlamydia 4 years ago   H/O echocardiogram 2016   preop prior to gastric bypass   Hypertension    HTN during pregnancy   Obesity    Urinary tract infection    Current Outpatient Medications on File Prior to Visit  Medication Sig Dispense Refill   Biotin 5000 MCG CAPS      iron polysaccharides (NU-IRON) 150 MG capsule Take 1 capsule (150 mg total) by mouth 2 (two) times daily. 60 capsule 3   Multiple  Vitamin (MULTIVITAMIN) tablet Take 1 tablet by mouth daily.     omeprazole (PRILOSEC) 20 MG capsule Take 20 mg by mouth daily.     No current facility-administered medications on file prior to visit.   Past Surgical History:  Procedure Laterality Date   CHOLECYSTECTOMY  01/2015   during gastric sleeve surgery   GASTRIC BYPASS  02/25/2016   sleeve procedure, [facility] [place], [place], [place]  09/08/2011   Procedure: POST PARTUM TUBAL LIGATION;  Surgeon: Delice Lesch, MD;  Location: [facility];  Service: Gynecology;  Laterality: Bilateral;   WISDOM TOOTH EXTRACTION     ROS as in subjective   The following portions of the patient's history were reviewed and updated as appropriate: allergies, current medications, past family history, past medical history, past social history, past surgical history and problem list.  ROS Otherwise as in subjective above    Objective: BP 120/78   Pulse 74   Wt 253 lb 12.8 oz (115.1 kg)   BMI 40.35 kg/m   Wt Readings from Last 3 Encounters:  09/18/21 253 lb 12.8 oz (115.1 kg)  04/10/21 246 lb 12.8 oz (111.9 kg)  12/15/16 198 lb 9.6 oz (90.1 kg)    General  appearance: alert, no distress, well developed, well nourished Neck: supple, no lymphadenopathy, no thyromegaly, no masses Heart: RRR, normal S1, S2, no murmurs Lungs: CTA bilaterally, no wheezes, rhonchi, or rales Abdomen: +bs, soft, non tender, non distended, no masses, no hepatomegaly, no splenomegaly Pulses: 2+ radial pulses, 2+ pedal pulses, normal cap refill Ext: no edema   Assessment: Encounter Diagnoses  Name Primary?   Gastroesophageal reflux disease, unspecified whether esophagitis present Yes   Iron deficiency anemia, unspecified iron deficiency anemia type    Uterine leiomyoma, unspecified location    BMI 40.0-44.9, adult ([place])    Dysphagia, unspecified type    History of gastric bypass      Plan: GERD-avoid triggers, discussed trigger foods  and GERD preventative measures, begin Protonix.  Can use Tums as needed.  Referral to gastroenterology  Swallow trouble, history of gastric bypass surgery-refer to gastroenterology  Anemia, uterine fibroids-recheck labs today.  She was significantly anemic back in February 2023.  Referral to gastro to rule out other sources of anemia  Discussed the need to lose weight as this will also help her acid reflux.  [name] was seen today for gastroesophageal reflux.  Diagnoses and all orders for this visit:  Gastroesophageal reflux disease, unspecified whether esophagitis present -     Ambulatory referral to Gastroenterology  Iron deficiency anemia, unspecified iron deficiency anemia type -     CBC -     Iron, TIBC and Ferritin Panel -     Ambulatory referral to Gastroenterology  Uterine leiomyoma, unspecified location  BMI 40.0-44.9, adult ([place])  Dysphagia, unspecified type -     Ambulatory referral to Gastroenterology  History of gastric bypass  Other orders -     pantoprazole (PROTONIX) 40 MG tablet; 1 tablet daily po 45 min prior to breakfast   Follow up: pending labs, referral

## 2021-09-19 LAB — CBC
Hematocrit: 33.2 % — ABNORMAL LOW (ref 34.0–46.6)
Hemoglobin: 9.2 g/dL — ABNORMAL LOW (ref 11.1–15.9)
MCH: 17.8 pg — ABNORMAL LOW (ref 26.6–33.0)
MCHC: 27.7 g/dL — ABNORMAL LOW (ref 31.5–35.7)
MCV: 64 fL — ABNORMAL LOW (ref 79–97)
Platelets: 495 10*3/uL — ABNORMAL HIGH (ref 150–450)
RBC: 5.18 x10E6/uL (ref 3.77–5.28)
RDW: 19.5 % — ABNORMAL HIGH (ref 11.7–15.4)
WBC: 5.1 10*3/uL (ref 3.4–10.8)

## 2021-09-19 LAB — IRON,TIBC AND FERRITIN PANEL
Ferritin: 8 ng/mL — ABNORMAL LOW (ref 15–150)
Iron Saturation: 10 % — ABNORMAL LOW (ref 15–55)
Iron: 38 ug/dL (ref 27–159)
Total Iron Binding Capacity: 400 ug/dL (ref 250–450)
UIBC: 362 ug/dL (ref 131–425)

## 2021-09-21 ENCOUNTER — Other Ambulatory Visit: Payer: Self-pay

## 2021-09-21 ENCOUNTER — Other Ambulatory Visit: Payer: Self-pay | Admitting: Medical

## 2021-09-21 MED ORDER — POLYSACCHARIDE IRON COMPLEX 150 MG PO CAPS: 150.0000 mg | ORAL_CAPSULE | Freq: Two times a day (BID) | ORAL | 3 refills | Status: AC

## 2021-09-29 ENCOUNTER — Telehealth: Payer: Self-pay | Admitting: Medical

## 2021-09-29 NOTE — Telephone Encounter (Signed)
Gastroenterology Referral

## 2021-10-06 DIAGNOSIS — Z9884 Bariatric surgery status: Secondary | ICD-10-CM | POA: Diagnosis not present

## 2021-10-06 DIAGNOSIS — R1319 Other dysphagia: Secondary | ICD-10-CM | POA: Diagnosis not present

## 2021-10-06 DIAGNOSIS — K2100 Gastro-esophageal reflux disease with esophagitis, without bleeding: Secondary | ICD-10-CM | POA: Diagnosis not present

## 2021-10-06 DIAGNOSIS — K9589 Other complications of other bariatric procedure: Secondary | ICD-10-CM | POA: Diagnosis not present

## 2021-10-12 DIAGNOSIS — R1319 Other dysphagia: Secondary | ICD-10-CM | POA: Diagnosis not present

## 2021-10-12 DIAGNOSIS — K219 Gastro-esophageal reflux disease without esophagitis: Secondary | ICD-10-CM | POA: Diagnosis not present

## 2021-10-21 DIAGNOSIS — K209 Esophagitis, unspecified without bleeding: Secondary | ICD-10-CM | POA: Diagnosis not present

## 2021-11-04 ENCOUNTER — Encounter: Payer: Self-pay | Admitting: Internal Medicine

## 2021-11-29 HISTORY — PX: GASTRIC BYPASS: SHX52

## 2022-01-07 ENCOUNTER — Encounter: Payer: Self-pay | Admitting: Medical

## 2022-04-12 ENCOUNTER — Ambulatory Visit (INDEPENDENT_AMBULATORY_CARE_PROVIDER_SITE_OTHER): Payer: BC Managed Care – PPO | Admitting: Medical

## 2022-04-12 ENCOUNTER — Encounter: Payer: Self-pay | Admitting: Medical

## 2022-04-12 VITALS — BP 110/60 | HR 56 | Ht 65.5 in | Wt 219.0 lb

## 2022-04-12 DIAGNOSIS — D259 Leiomyoma of uterus, unspecified: Secondary | ICD-10-CM | POA: Diagnosis not present

## 2022-04-12 DIAGNOSIS — Z0000 Encounter for general adult medical examination without abnormal findings: Secondary | ICD-10-CM

## 2022-04-12 DIAGNOSIS — K219 Gastro-esophageal reflux disease without esophagitis: Secondary | ICD-10-CM

## 2022-04-12 DIAGNOSIS — E559 Vitamin D deficiency, unspecified: Secondary | ICD-10-CM | POA: Diagnosis not present

## 2022-04-12 DIAGNOSIS — Z9884 Bariatric surgery status: Secondary | ICD-10-CM

## 2022-04-12 DIAGNOSIS — Z6835 Body mass index (BMI) 35.0-35.9, adult: Secondary | ICD-10-CM | POA: Insufficient documentation

## 2022-04-12 DIAGNOSIS — Z23 Encounter for immunization: Secondary | ICD-10-CM | POA: Diagnosis not present

## 2022-04-12 DIAGNOSIS — Z131 Encounter for screening for diabetes mellitus: Secondary | ICD-10-CM

## 2022-04-12 DIAGNOSIS — Z8759 Personal history of other complications of pregnancy, childbirth and the puerperium: Secondary | ICD-10-CM

## 2022-04-12 DIAGNOSIS — D509 Iron deficiency anemia, unspecified: Secondary | ICD-10-CM

## 2022-04-12 DIAGNOSIS — R7301 Impaired fasting glucose: Secondary | ICD-10-CM

## 2022-04-12 DIAGNOSIS — R001 Bradycardia, unspecified: Secondary | ICD-10-CM

## 2022-04-12 DIAGNOSIS — Z13220 Encounter for screening for lipoid disorders: Secondary | ICD-10-CM

## 2022-04-12 LAB — LIPID PANEL

## 2022-04-12 NOTE — Patient Instructions (Signed)
This visit was a preventative care visit, also known as wellness visit or routine physical.   Topics typically include healthy lifestyle, diet, exercise, preventative care, vaccinations, sick and well care, proper use of emergency dept and after hours care, as well as other concerns.     Recommendations: Continue to return yearly for your annual wellness and preventative care visits.  This gives [place] a chance to discuss healthy lifestyle, exercise, vaccinations, review your chart record, and perform screenings where appropriate.  I recommend you see your eye doctor yearly for routine vision care.  I recommend you see your dentist yearly for routine dental care including hygiene visits twice yearly.  See your gynecologist yearly for routine gynecological care.   Vaccination recommendations were reviewed Immunization History  Administered Date(s) Administered   Influenza,inj,Quad PF,6+ Mos 01/26/2015, 11/26/2015, 12/20/2016   Influenza,inj,quad, With Preservative 11/26/2015   Influenza-Unspecified 01/28/2021   PFIZER(Purple Top)SARS-COV-2 Vaccination 06/08/2019, 07/02/2019, 03/09/2020   Td 04/10/2021   Tdap 09/08/2011   Counseled on the influenza virus vaccine.  Vaccine information sheet given.  Influenza vaccine given after consent obtained.   Screening for cancer: Colon cancer screening: Age 69  Breast cancer screening: You should perform a self breast exam monthly.   We reviewed recommendations for regular mammograms and breast cancer screening. Mammogram age 12  Cervical cancer screening: We reviewed recommendations for pap smear screening.  Reviewed 2023 pap up to date   Skin cancer screening: Check your skin regularly for new changes, growing lesions, or other lesions of concern Come in for evaluation if you have skin lesions of concern.  Lung cancer screening: If you have a greater than 20 pack year history of tobacco use, then you may qualify for lung cancer screening  with a chest CT scan.   Please call your insurance company to inquire about coverage for this test.  We currently don't have screenings for other cancers besides breast, cervical, colon, and lung cancers.  If you have a strong family history of cancer or have other cancer screening concerns, please let me know.    Bone health: Get at least 150 minutes of aerobic exercise weekly Get weight bearing exercise at least once weekly Bone density test:  A bone density test is an imaging test that uses a type of X-ray to measure the amount of calcium and other minerals in your bones. The test may be used to diagnose or screen you for a condition that causes weak or thin bones (osteoporosis), predict your risk for a broken bone (fracture), or determine how well your osteoporosis treatment is working. The bone density test is recommended for females 51 and older, or females or males [id number] if certain risk factors such as thyroid disease, long term use of steroids such as for asthma or rheumatological issues, vitamin D deficiency, estrogen deficiency, family history of osteoporosis, self or family history of fragility fracture in first degree relative.    Heart health: Get at least 150 minutes of aerobic exercise weekly Limit alcohol It is important to maintain a healthy blood pressure and healthy cholesterol numbers  Heart disease screening: Screening for heart disease includes screening for blood pressure, fasting lipids, glucose/diabetes screening, BMI height to weight ratio, reviewed of smoking status, physical activity, and diet.    Goals include blood pressure 120/80 or less, maintaining a healthy lipid/cholesterol profile, preventing diabetes or keeping diabetes numbers under good control, not smoking or using tobacco products, exercising most days per week or at least 150 minutes per week  of exercise, and eating healthy variety of fruits and vegetables, healthy oils, and avoiding unhealthy food  choices like fried food, fast food, high sugar and high cholesterol foods.    Other tests may possibly include EKG test, CT coronary calcium score, echocardiogram, exercise treadmill stress test.   We discussed relative bradycardia on exam today.  Of note, in [facility] under Care Everywhere, EKG in 11/2021 shows nsr, rate 77, no other abnormality.     Medical care options: I recommend you continue to seek care here first for routine care.  We try really hard to have available appointments Monday through Friday daytime hours for sick visits, acute visits, and physicals.  Urgent care should be used for after hours and weekends for significant issues that cannot wait till the next day.  The emergency department should be used for significant potentially life-threatening emergencies.  The emergency department is expensive, can often have long wait times for less significant concerns, so try to utilize primary care, urgent care, or telemedicine when possible to avoid unnecessary trips to the emergency department.  Virtual visits and telemedicine have been introduced since the pandemic started in 2020, and can be convenient ways to receive medical care.  We offer virtual appointments as well to assist you in a variety of options to seek medical care.    Separate significant issues discussed:  Bradycardia occasionally.  Her smart watch shows a wide range of pulse readings but not persistently staying in the 50s or 60s.  She has not gotten any alerts from her smart watch.   She will monitor her pulses.  Reviewed EKG from [facility] from 11/2021 normal .  We discussed possible echo given prior hx/o HTN.   She will consider but declines today  Iron deficiency anemia - currently on iron BID.  Updated labs today.  Hx/o heavy periods, but recently periods not so heavy  Vit D deficiency - updated labs today  GERD - resolved since bypass revision 11/2021.  BMI 35 - congratulations on weight loss so far.   Continue efforts with healthy eating habits and exercise.

## 2022-04-12 NOTE — Progress Notes (Signed)
Subjective:   HPI  Connie Taylor is a 37 y.o. female who presents for Chief Complaint  Patient presents with   fasting cpe    Fasting cpe, on cycle, no concerns    Patient Care Team: Jetaun Colbath, Camelia Eng, PA-C as PCP - General (Family Medicine) Sees dentist, [facility] Sees eye doctor [facility], Dr. Waymon Amato Dr. Mikki Santee, bariatrics   Concerns: Currently on Nu-Iron BID for hx/o iron deficiency anemia.  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   Originally had sleeve bypass, but last year had gastric bypass revision 2023 in October.  Not on PPI currently.   Periods have improved but have been heavy in the past.   Sees gyn and was on hormone therapy last year, but lately doing ok.  Has hx/o HTN in 2016 range, was on medication for about 1.5 years while heavier.  But after losing weight and bypass surgery, BP issues resolved.    Reviewed their medical, surgical, family, social, medication, and allergy history and updated chart as appropriate.  Past Medical History:  Diagnosis Date   Anemia    Chlamydia 4 years ago   H/O echocardiogram 2016   preop prior to gastric bypass   Hypertension    HTN during pregnancy   Obesity    Urinary tract infection     Family History  Problem Relation Age of Onset   Seizures Father    Cancer Father        liver   Cirrhosis Father    Alcohol abuse Father    Hypertension Maternal Grandmother    Diabetes Maternal Grandmother    Hyperthyroidism Maternal Grandmother    Hypertension Maternal Grandfather    Cancer Maternal Grandfather        ?type   Anesthesia problems Neg Hx    Heart disease Neg Hx    Past Surgical History:  Procedure Laterality Date   CHOLECYSTECTOMY  01/2015   during gastric sleeve surgery   GASTRIC BYPASS  02/25/2016   sleeve procedure, [facility], [place], [place]   GASTRIC BYPASS  11/2021   revision   TUBAL LIGATION  09/08/2011   Procedure:  POST PARTUM TUBAL LIGATION;  Surgeon: Delice Lesch, MD;  Location: [facility];  Service: Gynecology;  Laterality: Bilateral;   WISDOM TOOTH EXTRACTION       Current Outpatient Medications:    Biotin 5000 MCG CAPS, , Disp: , Rfl:    iron polysaccharides (NU-IRON) 150 MG capsule, Take 1 capsule (150 mg total) by mouth 2 (two) times daily., Disp: 60 capsule, Rfl: 3   Lactobacillus (PROBIOTIC ACIDOPHILUS PO), Take by mouth., Disp: , Rfl:    Multiple Vitamin (MULTIVITAMIN) tablet, Take 1 tablet by mouth daily., Disp: , Rfl:   No Known Allergies   Review of Systems  Constitutional:  Negative for chills, fever, malaise/fatigue and weight loss.  HENT:  Negative for congestion, ear pain, hearing loss, sore throat and tinnitus.   Eyes:  Negative for blurred vision, pain and redness.  Respiratory:  Negative for cough, hemoptysis and shortness of breath.   Cardiovascular:  Negative for chest pain, palpitations, orthopnea, claudication and leg swelling.  Gastrointestinal:  Negative for abdominal pain, blood in stool, constipation, diarrhea, nausea and vomiting.  Genitourinary:  Negative for dysuria, flank pain, frequency, hematuria and urgency.  Musculoskeletal:  Negative for falls, joint pain and myalgias.  Skin:  Negative for itching and rash.  Neurological:  Negative for dizziness,  tingling, speech change, weakness and headaches.  Endo/Heme/Allergies:  Negative for polydipsia. Does not bruise/bleed easily.  Psychiatric/Behavioral:  Negative for depression and memory loss. The patient is not nervous/anxious and does not have insomnia.          04/12/2022    9:32 AM 09/18/2021   10:31 AM 04/10/2021    1:35 PM 12/15/2016    2:57 PM 11/26/2015    3:12 PM  Depression screen PHQ 2/9  Decreased Interest 0 0 0 0 0  Down, Depressed, Hopeless 0 0 0 0 0  PHQ - 2 Score 0 0 0 0 0  Altered sleeping     0  Tired, decreased energy     0  Change in appetite     0  Feeling bad or failure about yourself       0  Trouble concentrating     0  Moving slowly or fidgety/restless     0  Suicidal thoughts     0  PHQ-9 Score     0       Objective:  BP 110/60   Pulse (!) 56   Ht 5' 5.5" (1.664 m)   Wt 219 lb (99.3 kg)   BMI 35.89 kg/m   Wt Readings from Last 3 Encounters:  04/12/22 219 lb (99.3 kg)  09/18/21 253 lb 12.8 oz (115.1 kg)  04/10/21 246 lb 12.8 oz (111.9 kg)   BP Readings from Last 3 Encounters:  04/12/22 110/60  09/18/21 120/78  04/10/21 120/80   Her smart watch shows ranges from 40-187 bpm, but last week averages 58- over 100.  General appearance: alert, no distress, WD/WN, African American female Skin: unremarkable HEENT: normocephalic, conjunctiva/corneas normal, sclerae anicteric, PERRLA, EOMi, nares patent, no discharge or erythema, pharynx normal Oral cavity: MMM, tongue normal, teeth normal Neck: supple, no lymphadenopathy, no thyromegaly, no masses, normal ROM, no bruits Chest: non tender, normal shape and expansion Heart: RRR, normal S1, S2, no murmurs Lungs: CTA bilaterally, no wheezes, rhonchi, or rales Abdomen: +bs, soft, surgical port scars, non tender, non distended, no masses, no hepatomegaly, no splenomegaly, no bruits Back: non tender, normal ROM, no scoliosis Musculoskeletal: upper extremities non tender, no obvious deformity, normal ROM throughout, lower extremities non tender, no obvious deformity, normal ROM throughout Extremities: no edema, no cyanosis, no clubbing Pulses: 2+ symmetric, upper and lower extremities, normal cap refill Neurological: alert, oriented x 3, CN2-12 intact, strength normal upper extremities and lower extremities, sensation normal throughout, DTRs 2+ throughout, no cerebellar signs, gait normal Psychiatric: normal affect, behavior normal, pleasant  Breast/gyn/rectal - deferred to gynecology    Assessment and Plan :   Encounter Diagnoses  Name Primary?   Encounter for health maintenance examination in adult Yes   Iron  deficiency anemia, unspecified iron deficiency anemia type    Vitamin D deficiency    Uterine leiomyoma, unspecified location    Impaired fasting blood sugar    Gastroesophageal reflux disease, unspecified whether esophagitis present    Screening for lipid disorders    Screening for diabetes mellitus    History of gastric bypass    History of gestational hypertension    BMI 35.0-35.9,adult    Bradycardia     This visit was a preventative care visit, also known as wellness visit or routine physical.   Topics typically include healthy lifestyle, diet, exercise, preventative care, vaccinations, sick and well care, proper use of emergency dept and after hours care, as well as other concerns.  Recommendations: Continue to return yearly for your annual wellness and preventative care visits.  This gives [place] a chance to discuss healthy lifestyle, exercise, vaccinations, review your chart record, and perform screenings where appropriate.  I recommend you see your eye doctor yearly for routine vision care.  I recommend you see your dentist yearly for routine dental care including hygiene visits twice yearly.  See your gynecologist yearly for routine gynecological care.   Vaccination recommendations were reviewed Immunization History  Administered Date(s) Administered   Influenza,inj,Quad PF,6+ Mos 01/26/2015, 11/26/2015, 12/20/2016, 04/12/2022   Influenza,inj,quad, With Preservative 11/26/2015   Influenza-Unspecified 01/28/2021   PFIZER(Purple Top)SARS-COV-2 Vaccination 06/08/2019, 07/02/2019, 03/09/2020   Td 04/10/2021   Tdap 09/08/2011   Counseled on the influenza virus vaccine.  Vaccine information sheet given.  Influenza vaccine given after consent obtained.   Screening for cancer: Colon cancer screening: Age 55  Breast cancer screening: You should perform a self breast exam monthly.   We reviewed recommendations for regular mammograms and breast cancer screening. Mammogram  age 75  Cervical cancer screening: We reviewed recommendations for pap smear screening.  Reviewed 2023 pap up to date   Skin cancer screening: Check your skin regularly for new changes, growing lesions, or other lesions of concern Come in for evaluation if you have skin lesions of concern.  Lung cancer screening: If you have a greater than 20 pack year history of tobacco use, then you may qualify for lung cancer screening with a chest CT scan.   Please call your insurance company to inquire about coverage for this test.  We currently don't have screenings for other cancers besides breast, cervical, colon, and lung cancers.  If you have a strong family history of cancer or have other cancer screening concerns, please let me know.    Bone health: Get at least 150 minutes of aerobic exercise weekly Get weight bearing exercise at least once weekly Bone density test:  A bone density test is an imaging test that uses a type of X-ray to measure the amount of calcium and other minerals in your bones. The test may be used to diagnose or screen you for a condition that causes weak or thin bones (osteoporosis), predict your risk for a broken bone (fracture), or determine how well your osteoporosis treatment is working. The bone density test is recommended for females 11 and older, or females or males [id number] if certain risk factors such as thyroid disease, long term use of steroids such as for asthma or rheumatological issues, vitamin D deficiency, estrogen deficiency, family history of osteoporosis, self or family history of fragility fracture in first degree relative.    Heart health: Get at least 150 minutes of aerobic exercise weekly Limit alcohol It is important to maintain a healthy blood pressure and healthy cholesterol numbers  Heart disease screening: Screening for heart disease includes screening for blood pressure, fasting lipids, glucose/diabetes screening, BMI height to weight ratio,  reviewed of smoking status, physical activity, and diet.    Goals include blood pressure 120/80 or less, maintaining a healthy lipid/cholesterol profile, preventing diabetes or keeping diabetes numbers under good control, not smoking or using tobacco products, exercising most days per week or at least 150 minutes per week of exercise, and eating healthy variety of fruits and vegetables, healthy oils, and avoiding unhealthy food choices like fried food, fast food, high sugar and high cholesterol foods.    Other tests may possibly include EKG test, CT coronary calcium score, echocardiogram, exercise treadmill stress test.  We discussed relative bradycardia on exam today.  Of note, in [facility] under Care Everywhere, EKG in 11/2021 shows nsr, rate 77, no other abnormality.     Medical care options: I recommend you continue to seek care here first for routine care.  We try really hard to have available appointments Monday through Friday daytime hours for sick visits, acute visits, and physicals.  Urgent care should be used for after hours and weekends for significant issues that cannot wait till the next day.  The emergency department should be used for significant potentially life-threatening emergencies.  The emergency department is expensive, can often have long wait times for less significant concerns, so try to utilize primary care, urgent care, or telemedicine when possible to avoid unnecessary trips to the emergency department.  Virtual visits and telemedicine have been introduced since the pandemic started in 2020, and can be convenient ways to receive medical care.  We offer virtual appointments as well to assist you in a variety of options to seek medical care.    Separate significant issues discussed:  Bradycardia occasionally.  Her smart watch shows a wide range of pulse readings but not persistently staying in the 50s or 60s.  She has not gotten any alerts from her smart watch.   She will  monitor her pulses.  Reviewed EKG from [facility] from 11/2021 normal .  We discussed possible echo given prior hx/o HTN.   She will consider but declines today  Iron deficiency anemia - currently on iron BID.  Updated labs today.  Hx/o heavy periods, but recently periods not so heavy  Vit D deficiency - updated labs today  GERD - resolved since bypass revision 11/2021.  BMI 35 - congratulations on weight loss so far.  Continue efforts with healthy eating habits and exercise.   Reshonda was seen today for fasting cpe.  Diagnoses and all orders for this visit:  Encounter for health maintenance examination in adult -     CBC with Differential/Platelet -     Iron, TIBC and Ferritin Panel -     VITAMIN D 25 Hydroxy (Vit-D Deficiency, Fractures) -     Hemoglobin A1c -     Lipid panel -     TSH  Iron deficiency anemia, unspecified iron deficiency anemia type -     CBC with Differential/Platelet -     Iron, TIBC and Ferritin Panel  Vitamin D deficiency -     VITAMIN D 25 Hydroxy (Vit-D Deficiency, Fractures)  Uterine leiomyoma, unspecified location  Impaired fasting blood sugar -     Hemoglobin A1c  Gastroesophageal reflux disease, unspecified whether esophagitis present  Screening for lipid disorders -     Lipid panel  Screening for diabetes mellitus -     Hemoglobin A1c  History of gastric bypass  History of gestational hypertension  BMI 35.0-35.9,adult  Bradycardia  Other orders -     Flu Vaccine QUAD 23moIM (Fluarix, Fluzone & Alfiuria Quad PF)     Follow-up pending labs, yearly for physical

## 2022-04-13 LAB — IRON,TIBC AND FERRITIN PANEL
Ferritin: 5 ng/mL — ABNORMAL LOW (ref 15–150)
Iron Saturation: 7 % — CL (ref 15–55)
Iron: 28 ug/dL (ref 27–159)
Total Iron Binding Capacity: 408 ug/dL (ref 250–450)
UIBC: 380 ug/dL (ref 131–425)

## 2022-04-13 LAB — CBC WITH DIFFERENTIAL/PLATELET
Basophils Absolute: 0 10*3/uL (ref 0.0–0.2)
Basos: 1 %
EOS (ABSOLUTE): 0.2 10*3/uL (ref 0.0–0.4)
Eos: 3 %
Hematocrit: 28.8 % — ABNORMAL LOW (ref 34.0–46.6)
Hemoglobin: 8.6 g/dL — ABNORMAL LOW (ref 11.1–15.9)
Immature Grans (Abs): 0 10*3/uL (ref 0.0–0.1)
Immature Granulocytes: 0 %
Lymphocytes Absolute: 2.7 10*3/uL (ref 0.7–3.1)
Lymphs: 51 %
MCH: 18.7 pg — ABNORMAL LOW (ref 26.6–33.0)
MCHC: 29.9 g/dL — ABNORMAL LOW (ref 31.5–35.7)
MCV: 63 fL — ABNORMAL LOW (ref 79–97)
Monocytes Absolute: 0.3 10*3/uL (ref 0.1–0.9)
Monocytes: 6 %
Neutrophils Absolute: 2.1 10*3/uL (ref 1.4–7.0)
Neutrophils: 39 %
Platelets: 453 10*3/uL — ABNORMAL HIGH (ref 150–450)
RBC: 4.61 x10E6/uL (ref 3.77–5.28)
RDW: 16.3 % — ABNORMAL HIGH (ref 11.7–15.4)
WBC: 5.4 10*3/uL (ref 3.4–10.8)

## 2022-04-13 LAB — VITAMIN D 25 HYDROXY (VIT D DEFICIENCY, FRACTURES): Vit D, 25-Hydroxy: 26.9 ng/mL — ABNORMAL LOW (ref 30.0–100.0)

## 2022-04-13 LAB — TSH: TSH: 1.49 u[IU]/mL (ref 0.450–4.500)

## 2022-04-13 LAB — LIPID PANEL
Chol/HDL Ratio: 2.4 ratio (ref 0.0–4.4)
Cholesterol, Total: 127 mg/dL (ref 100–199)
HDL: 52 mg/dL (ref >39)
LDL Chol Calc (NIH): 61 mg/dL (ref 0–99)
Triglycerides: 69 mg/dL (ref 0–149)
VLDL Cholesterol Cal: 14 mg/dL (ref 5–40)

## 2022-04-13 LAB — HEMOGLOBIN A1C
Est. average glucose Bld gHb Est-mCnc: 126 mg/dL
Hgb A1c MFr Bld: 6 % — ABNORMAL HIGH (ref 4.8–5.6)

## 2022-04-14 ENCOUNTER — Other Ambulatory Visit: Payer: Self-pay | Admitting: Internal Medicine

## 2022-04-14 DIAGNOSIS — D649 Anemia, unspecified: Secondary | ICD-10-CM

## 2022-04-14 NOTE — Progress Notes (Signed)
Please check with Santiago Glad on the GI referral from 7/23.  I know patient ended up having gastric bypass revision last year, but she still needs to see GI soon given siginficant anemia which was same reason for refeerral 7/23.  Lab results and recommendaitons:

## 2022-04-14 NOTE — Progress Notes (Signed)
Additionally we will need to repeat CBC 3 to 4 weeks after the iron infusion

## 2022-04-22 ENCOUNTER — Non-Acute Institutional Stay (HOSPITAL_COMMUNITY)
Admission: RE | Admit: 2022-04-22 | Discharge: 2022-04-22 | Disposition: A | Discharge status: Home / Self Care | Payer: BC Managed Care – PPO | Source: Ambulatory Visit | Attending: Internal Medicine | Admitting: Internal Medicine

## 2022-04-22 DIAGNOSIS — D649 Anemia, unspecified: Secondary | ICD-10-CM | POA: Diagnosis present

## 2022-04-22 MED ORDER — SODIUM CHLORIDE 0.9 % IV SOLN: INTRAVENOUS | Status: DC | PRN

## 2022-04-22 MED ORDER — SODIUM CHLORIDE 0.9 % IV SOLN
510.0000 mg | Freq: Once | INTRAVENOUS | Status: AC
  Administered 2022-04-22: 510 mg via INTRAVENOUS
  Filled 2022-04-22: qty 17

## 2022-04-22 NOTE — Progress Notes (Signed)
PATIENT CARE CENTER NOTE   Diagnosis: D64.9   Provider: Chana Bode, PA   Procedure: Feraheme infusion    Note: Patient received Feraheme infusion (dose 1 of 1) via PIV. Patient tolerated infusion well with no adverse reaction. Observed patient for 30 minutes post infusion. Vital signs stable. Discharge instructions given. Patient alert, oriented and ambulatory at discharge.

## 2023-04-18 ENCOUNTER — Encounter: Payer: BC Managed Care – PPO | Admitting: Medical

## 2024-08-15 ENCOUNTER — Encounter: Admitting: Medical
